# Patient Record
Sex: Male | Born: 1940 | Race: Black or African American | Hispanic: No | Marital: Married | Smoking: Former smoker
Health system: Southern US, Community
[De-identification: ages and names within clinical notes are randomized; demographics above are authoritative.]

## PROBLEM LIST (undated history)

## (undated) DIAGNOSIS — I255 Ischemic cardiomyopathy: Secondary | ICD-10-CM

## (undated) DIAGNOSIS — G629 Polyneuropathy, unspecified: Secondary | ICD-10-CM

## (undated) DIAGNOSIS — I10 Essential (primary) hypertension: Secondary | ICD-10-CM

## (undated) DIAGNOSIS — E785 Hyperlipidemia, unspecified: Secondary | ICD-10-CM

## (undated) DIAGNOSIS — G709 Myoneural disorder, unspecified: Secondary | ICD-10-CM

## (undated) DIAGNOSIS — I2510 Atherosclerotic heart disease of native coronary artery without angina pectoris: Secondary | ICD-10-CM

## (undated) DIAGNOSIS — I509 Heart failure, unspecified: Secondary | ICD-10-CM

## (undated) DIAGNOSIS — E119 Type 2 diabetes mellitus without complications: Secondary | ICD-10-CM

## (undated) HISTORY — PX: OTHER SURGICAL HISTORY: SHX169

---

## 2006-05-21 ENCOUNTER — Ambulatory Visit: Payer: Self-pay | Admitting: Otolaryngology

## 2007-04-11 ENCOUNTER — Emergency Department: Payer: Self-pay | Admitting: Emergency Medicine

## 2007-04-13 ENCOUNTER — Emergency Department: Payer: Self-pay | Admitting: Emergency Medicine

## 2009-04-04 ENCOUNTER — Ambulatory Visit: Payer: Self-pay | Admitting: Family Medicine

## 2009-04-26 ENCOUNTER — Ambulatory Visit: Payer: Self-pay | Admitting: Family Medicine

## 2009-05-31 ENCOUNTER — Ambulatory Visit: Payer: Self-pay | Admitting: Otolaryngology

## 2009-09-13 ENCOUNTER — Ambulatory Visit: Payer: Self-pay | Admitting: Gastroenterology

## 2010-03-26 ENCOUNTER — Ambulatory Visit: Payer: Self-pay | Admitting: Internal Medicine

## 2012-11-11 ENCOUNTER — Emergency Department: Payer: Self-pay | Admitting: Emergency Medicine

## 2015-02-03 ENCOUNTER — Encounter: Payer: Self-pay | Admitting: *Deleted

## 2015-02-06 ENCOUNTER — Ambulatory Visit
Admission: RE | Admit: 2015-02-06 | Discharge: 2015-02-06 | Disposition: A | Discharge status: Home / Self Care | Payer: Medicare Other | Source: Ambulatory Visit | Attending: Gastroenterology | Admitting: Gastroenterology

## 2015-02-06 ENCOUNTER — Encounter
Admission: RE | Disposition: A | Discharge status: Home / Self Care | Payer: Self-pay | Source: Ambulatory Visit | Attending: Gastroenterology

## 2015-02-06 ENCOUNTER — Ambulatory Visit: Payer: Medicare Other | Admitting: Registered Nurse

## 2015-02-06 DIAGNOSIS — E785 Hyperlipidemia, unspecified: Secondary | ICD-10-CM | POA: Insufficient documentation

## 2015-02-06 DIAGNOSIS — Z86010 Personal history of colonic polyps: Secondary | ICD-10-CM | POA: Insufficient documentation

## 2015-02-06 DIAGNOSIS — F17200 Nicotine dependence, unspecified, uncomplicated: Secondary | ICD-10-CM | POA: Diagnosis not present

## 2015-02-06 DIAGNOSIS — Z79899 Other long term (current) drug therapy: Secondary | ICD-10-CM | POA: Insufficient documentation

## 2015-02-06 DIAGNOSIS — Z09 Encounter for follow-up examination after completed treatment for conditions other than malignant neoplasm: Secondary | ICD-10-CM | POA: Insufficient documentation

## 2015-02-06 DIAGNOSIS — I10 Essential (primary) hypertension: Secondary | ICD-10-CM | POA: Insufficient documentation

## 2015-02-06 DIAGNOSIS — E1140 Type 2 diabetes mellitus with diabetic neuropathy, unspecified: Secondary | ICD-10-CM | POA: Diagnosis not present

## 2015-02-06 HISTORY — DX: Polyneuropathy, unspecified: G62.9

## 2015-02-06 HISTORY — DX: Essential (primary) hypertension: I10

## 2015-02-06 HISTORY — PX: COLONOSCOPY WITH PROPOFOL: SHX5780

## 2015-02-06 HISTORY — DX: Myoneural disorder, unspecified: G70.9

## 2015-02-06 HISTORY — DX: Hyperlipidemia, unspecified: E78.5

## 2015-02-06 HISTORY — DX: Type 2 diabetes mellitus without complications: E11.9

## 2015-02-06 LAB — GLUCOSE, CAPILLARY: GLUCOSE-CAPILLARY: 102 mg/dL — AB (ref 65–99)

## 2015-02-06 SURGERY — COLONOSCOPY WITH PROPOFOL
Anesthesia: General

## 2015-02-06 MED ORDER — PROPOFOL INFUSION 10 MG/ML OPTIME
INTRAVENOUS | Status: DC | PRN
  Administered 2015-02-06: 160 ug/kg/min via INTRAVENOUS

## 2015-02-06 MED ORDER — SODIUM CHLORIDE 0.9 % IV SOLN
INTRAVENOUS | Status: DC
  Administered 2015-02-06: 1000 mL via INTRAVENOUS

## 2015-02-06 MED ORDER — SODIUM CHLORIDE 0.9 % IV SOLN: INTRAVENOUS | Status: DC

## 2015-02-06 MED ORDER — MIDAZOLAM HCL 2 MG/2ML IJ SOLN
INTRAMUSCULAR | Status: DC | PRN
  Administered 2015-02-06: 2 mg via INTRAVENOUS

## 2015-02-06 MED ORDER — FENTANYL CITRATE (PF) 100 MCG/2ML IJ SOLN
INTRAMUSCULAR | Status: DC | PRN
  Administered 2015-02-06: 50 ug via INTRAVENOUS

## 2015-02-06 MED ORDER — EPHEDRINE SULFATE 50 MG/ML IJ SOLN
INTRAMUSCULAR | Status: DC | PRN
  Administered 2015-02-06: 10 mg via INTRAVENOUS
  Administered 2015-02-06: 15 mg via INTRAVENOUS

## 2015-02-06 MED ORDER — LIDOCAINE HCL (CARDIAC) 20 MG/ML IV SOLN
INTRAVENOUS | Status: DC | PRN
  Administered 2015-02-06: 40 mg via INTRAVENOUS

## 2015-02-06 MED ORDER — PROPOFOL 10 MG/ML IV BOLUS
INTRAVENOUS | Status: DC | PRN
  Administered 2015-02-06: 50 mg via INTRAVENOUS

## 2015-02-06 NOTE — Anesthesia Postprocedure Evaluation (Signed)
  Anesthesia Post-op Note  Patient: Todd Perez  Procedure(s) Performed: Procedure(s): COLONOSCOPY WITH PROPOFOL (N/A)  Anesthesia type:General  Patient location: PACU  Post pain: Pain level controlled  Post assessment: Post-op Vital signs reviewed, Patient's Cardiovascular Status Stable, Respiratory Function Stable, Patent Airway and No signs of Nausea or vomiting  Post vital signs: Reviewed and stable  Last Vitals:  Filed Vitals:   02/06/15 0900  BP: 113/70  Pulse: 61  Temp:   Resp: 19    Level of consciousness: awake, alert  and patient cooperative  Complications: No apparent anesthesia complications

## 2015-02-06 NOTE — Anesthesia Preprocedure Evaluation (Signed)
Anesthesia Evaluation  Patient identified by MRN, date of birth, ID band Patient awake    Reviewed: Allergy & Precautions, H&P , NPO status , Patient's Chart, lab work & pertinent test results, reviewed documented beta blocker date and time   Airway Mallampati: II  TM Distance: >3 FB Neck ROM: full    Dental no notable dental hx.    Pulmonary neg pulmonary ROS, Current Smoker,  breath sounds clear to auscultation  Pulmonary exam normal       Cardiovascular Exercise Tolerance: Good hypertension, negative cardio ROS  Rhythm:regular Rate:Normal     Neuro/Psych  Neuromuscular disease negative neurological ROS  negative psych ROS   GI/Hepatic negative GI ROS, Neg liver ROS,   Endo/Other  negative endocrine ROSdiabetes  Renal/GU negative Renal ROS  negative genitourinary   Musculoskeletal   Abdominal   Peds  Hematology negative hematology ROS (+)   Anesthesia Other Findings   Reproductive/Obstetrics negative OB ROS                             Anesthesia Physical Anesthesia Plan  ASA: III  Anesthesia Plan: General   Post-op Pain Management:    Induction:   Airway Management Planned:   Additional Equipment:   Intra-op Plan:   Post-operative Plan:   Informed Consent: I have reviewed the patients History and Physical, chart, labs and discussed the procedure including the risks, benefits and alternatives for the proposed anesthesia with the patient or authorized representative who has indicated his/her understanding and acceptance.   Dental Advisory Given  Plan Discussed with: CRNA  Anesthesia Plan Comments:         Anesthesia Quick Evaluation

## 2015-02-06 NOTE — Transfer of Care (Signed)
Immediate Anesthesia Transfer of Care Note  Patient: Todd Perez  Procedure(s) Performed: Procedure(s): COLONOSCOPY WITH PROPOFOL (N/A)  Patient Location: PACU and Endoscopy Unit  Anesthesia Type:General  Level of Consciousness: sedated  Airway & Oxygen Therapy: Patient Spontanous Breathing and Patient connected to nasal cannula oxygen  Post-op Assessment: Report given to RN and Post -op Vital signs reviewed and stable  Post vital signs: Reviewed and stable  Last Vitals:  Filed Vitals:   02/06/15 0837  BP: 94/56  Pulse: 59  Temp: 36.4 C  Resp: 14    Complications: No apparent anesthesia complications

## 2015-02-06 NOTE — H&P (Signed)
    Primary Care Physician:  [name], MD Primary Gastroenterologist:  Dr. Bluford Kaufmann  Pre-Procedure History & Physical: HPI:  Todd Perez is a 74 y.o. male is here for colonoscopy.   Past Medical History  Diagnosis Date  . Diabetes mellitus without complication   . Neuromuscular disorder   . Peripheral neuropathy   . Hyperlipidemia   . Hypertension     No past surgical history on file.  Prior to Admission medications   Medication Sig Start Date End Date Taking? Authorizing Provider  allopurinol (ZYLOPRIM) 100 MG tablet Take 100 mg by mouth daily.   Yes Historical Provider, MD  atenolol (TENORMIN) 100 MG tablet Take 100 mg by mouth daily.   Yes Historical Provider, MD  gabapentin (NEURONTIN) 300 MG capsule Take 300 mg by mouth 3 (three) times daily.   Yes Historical Provider, MD  glipiZIDE (GLUCOTROL) 10 MG tablet Take 10 mg by mouth daily before breakfast.   Yes Historical Provider, MD  losartan-hydrochlorothiazide (HYZAAR) 100-25 MG per tablet Take 1 tablet by mouth daily.   Yes Historical Provider, MD  metFORMIN (GLUCOPHAGE) 500 MG tablet Take by mouth 2 (two) times daily with a meal.   Yes Historical Provider, MD  pravastatin (PRAVACHOL) 40 MG tablet Take 40 mg by mouth daily.   Yes Historical Provider, MD  varenicline (CHANTIX PAK) 0.5 MG X 11 & 1 MG X 42 tablet Take by mouth 2 (two) times daily. Take one 0.5 mg tablet by mouth once daily for 3 days, then increase to one 0.5 mg tablet twice daily for 4 days, then increase to one 1 mg tablet twice daily.   Yes Historical Provider, MD    Allergies as of 01/25/2015  . (Not on File)    No family history on file.  History   Social History  . Marital Status: Married    Spouse Name: N/A  . Number of Children: N/A  . Years of Education: N/A   Occupational History  . Not on file.   Social History Main Topics  . Smoking status: Current Every Day Smoker  . Smokeless tobacco: Not on file  . Alcohol Use: Not on file  .  Drug Use: Not on file  . Sexual Activity: Not on file   Other Topics Concern  . Not on file   Social History Narrative  . No narrative on file    Review of Systems: See HPI, otherwise negative ROS  Physical Exam: BP 119/69 mmHg  Pulse 56  Temp(Src) 96.5 F (35.8 C) (Tympanic)  Resp 16  Ht 6\' 2"  (1.88 m)  Wt 89.812 kg (198 lb)  BMI 25.41 kg/m2  SpO2 100% General:   Alert,  pleasant and cooperative in NAD Head:  Normocephalic and atraumatic. Neck:  Supple; no masses or thyromegaly. Lungs:  Clear throughout to auscultation.    Heart:  Regular rate and rhythm. Abdomen:  Soft, nontender and nondistended. Normal bowel sounds, without guarding, and without rebound.   Neurologic:  Alert and  oriented x4;  grossly normal neurologically.  Impression/Plan: Todd Perez is here for an colonoscpy  to be performed for personal hx of colon polyps  Risks, benefits, limitations, and alternatives regarding colonoscopy have been reviewed with the patient.  Questions have been answered.  All parties agreeable.   Todd Perez, Todd Standing, MD  02/06/2015, 8:06 AM

## 2015-02-06 NOTE — Op Note (Signed)
[facility] Gastroenterology Patient Name: Todd Perez Procedure Date: 02/06/2015 8:02 AM MRN: [id number] Account #: [id number] Date of Birth: 1940/09/16 Admit Type: Outpatient Age: 74 Room: [facility] ENDO ROOM 4 Gender: Male Note Status: Finalized Procedure:         Colonoscopy Indications:       Personal history of colonic polyps Providers:         Ezzard Standing. Bluford Kaufmann, MD Referring MD:      Rhona Leavens. Burnett Sheng, MD (Referring MD) Medicines:         Monitored Anesthesia Care Complications:     No immediate complications. Procedure:         Pre-Anesthesia Assessment:                    - Prior to the procedure, a History and Physical was                     performed, and patient medications, allergies and                     sensitivities were reviewed. The patient's tolerance of                     previous anesthesia was reviewed.                    - The risks and benefits of the procedure and the sedation                     options and risks were discussed with the patient. All                     questions were answered and informed consent was obtained.                    - After reviewing the risks and benefits, the patient was                     deemed in satisfactory condition to undergo the procedure.                    After obtaining informed consent, the colonoscope was                     passed under direct vision. Throughout the procedure, the                     patient's blood pressure, pulse, and oxygen saturations                     were monitored continuously. The Colonoscope was                     introduced through the anus and advanced to the the cecum,                     identified by appendiceal orifice and ileocecal valve. The                     colonoscopy was performed without difficulty. The patient                     tolerated the procedure well. The quality of the bowel  preparation was good. Findings:      The colon  (entire examined portion) appeared normal. Impression:        - The entire examined colon is normal.                    - No specimens collected. Recommendation:    - Discharge patient to home.                    - Repeat colonoscopy in 5 years for surveillance.                    - The findings and recommendations were discussed with the                     patient. Procedure Code(s): --- Professional ---                    [phone], Colonoscopy, flexible; diagnostic, including                     collection of specimen(s) by brushing or washing, when                     performed (separate procedure) Diagnosis Code(s): --- Professional ---                    Z86.010, Personal history of colonic polyps CPT copyright 2014 American Medical Association. All rights reserved. The codes documented in this report are preliminary and upon coder review may  be revised to meet current compliance requirements. Wallace Cullens, MD 02/06/2015 8:27:41 AM This report has been signed electronically. Number of Addenda: 0 Note Initiated On: 02/06/2015 8:02 AM Scope Withdrawal Time: 0 hours 8 minutes 41 seconds  Total Procedure Duration: 0 hours 13 minutes 56 seconds       [facility]

## 2015-02-06 NOTE — Anesthesia Procedure Notes (Signed)
Date/Time: 02/06/2015 8:03 AM Performed by: Stormy Fabian Pre-anesthesia Checklist: Patient identified, Emergency Drugs available, Suction available and Patient being monitored Patient Re-evaluated:Patient Re-evaluated prior to inductionOxygen Delivery Method: Nasal cannula

## 2015-02-08 ENCOUNTER — Encounter: Payer: Self-pay | Admitting: Gastroenterology

## 2018-04-18 ENCOUNTER — Other Ambulatory Visit: Payer: Self-pay

## 2018-04-18 ENCOUNTER — Emergency Department: Payer: Medicare Other

## 2018-04-18 ENCOUNTER — Observation Stay
Admission: EM | Admit: 2018-04-18 | Discharge: 2018-04-20 | Disposition: A | Discharge status: Home / Self Care | Payer: Medicare Other | Attending: Internal Medicine | Admitting: Internal Medicine

## 2018-04-18 DIAGNOSIS — R0602 Shortness of breath: Secondary | ICD-10-CM | POA: Diagnosis present

## 2018-04-18 DIAGNOSIS — Z79899 Other long term (current) drug therapy: Secondary | ICD-10-CM | POA: Diagnosis not present

## 2018-04-18 DIAGNOSIS — I272 Pulmonary hypertension, unspecified: Secondary | ICD-10-CM | POA: Diagnosis not present

## 2018-04-18 DIAGNOSIS — M109 Gout, unspecified: Secondary | ICD-10-CM | POA: Diagnosis not present

## 2018-04-18 DIAGNOSIS — E785 Hyperlipidemia, unspecified: Secondary | ICD-10-CM | POA: Diagnosis not present

## 2018-04-18 DIAGNOSIS — Z7984 Long term (current) use of oral hypoglycemic drugs: Secondary | ICD-10-CM | POA: Diagnosis not present

## 2018-04-18 DIAGNOSIS — I5033 Acute on chronic diastolic (congestive) heart failure: Secondary | ICD-10-CM | POA: Insufficient documentation

## 2018-04-18 DIAGNOSIS — R778 Other specified abnormalities of plasma proteins: Secondary | ICD-10-CM

## 2018-04-18 DIAGNOSIS — R918 Other nonspecific abnormal finding of lung field: Secondary | ICD-10-CM | POA: Diagnosis not present

## 2018-04-18 DIAGNOSIS — Z23 Encounter for immunization: Secondary | ICD-10-CM | POA: Diagnosis not present

## 2018-04-18 DIAGNOSIS — Z87891 Personal history of nicotine dependence: Secondary | ICD-10-CM | POA: Insufficient documentation

## 2018-04-18 DIAGNOSIS — E1142 Type 2 diabetes mellitus with diabetic polyneuropathy: Secondary | ICD-10-CM | POA: Diagnosis not present

## 2018-04-18 DIAGNOSIS — I509 Heart failure, unspecified: Secondary | ICD-10-CM

## 2018-04-18 DIAGNOSIS — R7989 Other specified abnormal findings of blood chemistry: Secondary | ICD-10-CM | POA: Insufficient documentation

## 2018-04-18 DIAGNOSIS — I081 Rheumatic disorders of both mitral and tricuspid valves: Secondary | ICD-10-CM | POA: Insufficient documentation

## 2018-04-18 DIAGNOSIS — I110 Hypertensive heart disease with heart failure: Principal | ICD-10-CM | POA: Insufficient documentation

## 2018-04-18 DIAGNOSIS — R748 Abnormal levels of other serum enzymes: Secondary | ICD-10-CM | POA: Diagnosis present

## 2018-04-18 LAB — CBC
HCT: 38.9 % — ABNORMAL LOW (ref 40.0–52.0)
Hemoglobin: 13.4 g/dL (ref 13.0–18.0)
MCH: 30.7 pg (ref 26.0–34.0)
MCHC: 34.5 g/dL (ref 32.0–36.0)
MCV: 89.1 fL (ref 80.0–100.0)
PLATELETS: 294 10*3/uL (ref 150–440)
RBC: 4.37 MIL/uL — AB (ref 4.40–5.90)
RDW: 14.9 % — AB (ref 11.5–14.5)
WBC: 7 10*3/uL (ref 3.8–10.6)

## 2018-04-18 LAB — BASIC METABOLIC PANEL
Anion gap: 8 (ref 5–15)
BUN: 15 mg/dL (ref 8–23)
CALCIUM: 9.1 mg/dL (ref 8.9–10.3)
CHLORIDE: 103 mmol/L (ref 98–111)
CO2: 27 mmol/L (ref 22–32)
CREATININE: 1.22 mg/dL (ref 0.61–1.24)
GFR calc Af Amer: >60 mL/min (ref >60)
GFR calc non Af Amer: 55 mL/min — ABNORMAL LOW (ref >60)
Glucose, Bld: 107 mg/dL — ABNORMAL HIGH (ref 70–99)
Potassium: 4.3 mmol/L (ref 3.5–5.1)
SODIUM: 138 mmol/L (ref 135–145)

## 2018-04-18 LAB — TROPONIN I
TROPONIN I: 0.16 ng/mL — AB (ref <0.03)
TROPONIN I: 0.16 ng/mL — AB (ref <0.03)
TROPONIN I: 0.13 ng/mL — AB (ref <0.03)

## 2018-04-18 LAB — BRAIN NATRIURETIC PEPTIDE: B Natriuretic Peptide: 585 pg/mL — ABNORMAL HIGH (ref 0.0–100.0)

## 2018-04-18 MED ORDER — FUROSEMIDE 10 MG/ML IJ SOLN
20.0000 mg | Freq: Two times a day (BID) | INTRAMUSCULAR | Status: DC
  Administered 2018-04-18 – 2018-04-20 (×4): 20 mg via INTRAVENOUS
  Filled 2018-04-18 (×4): qty 2

## 2018-04-18 MED ORDER — ACETAMINOPHEN 650 MG RE SUPP: 650.0000 mg | Freq: Four times a day (QID) | RECTAL | Status: DC | PRN

## 2018-04-18 MED ORDER — ONDANSETRON HCL 4 MG/2ML IJ SOLN: 4.0000 mg | Freq: Four times a day (QID) | INTRAMUSCULAR | Status: DC | PRN

## 2018-04-18 MED ORDER — ACETAMINOPHEN 325 MG PO TABS: 650.0000 mg | ORAL_TABLET | Freq: Four times a day (QID) | ORAL | Status: DC | PRN

## 2018-04-18 MED ORDER — LOSARTAN POTASSIUM 50 MG PO TABS
100.0000 mg | ORAL_TABLET | Freq: Every day | ORAL | Status: DC
  Administered 2018-04-19 – 2018-04-20 (×2): 100 mg via ORAL
  Filled 2018-04-18 (×2): qty 2

## 2018-04-18 MED ORDER — ONDANSETRON HCL 4 MG PO TABS: 4.0000 mg | ORAL_TABLET | Freq: Four times a day (QID) | ORAL | Status: DC | PRN

## 2018-04-18 MED ORDER — ENOXAPARIN SODIUM 40 MG/0.4ML ~~LOC~~ SOLN
40.0000 mg | SUBCUTANEOUS | Status: DC
  Administered 2018-04-18 – 2018-04-19 (×2): 40 mg via SUBCUTANEOUS
  Filled 2018-04-18 (×2): qty 0.4

## 2018-04-18 MED ORDER — HYDROCHLOROTHIAZIDE 25 MG PO TABS
25.0000 mg | ORAL_TABLET | Freq: Every day | ORAL | Status: DC
  Administered 2018-04-19 – 2018-04-20 (×2): 25 mg via ORAL
  Filled 2018-04-18 (×2): qty 1

## 2018-04-18 MED ORDER — ASPIRIN 81 MG PO CHEW
324.0000 mg | CHEWABLE_TABLET | Freq: Once | ORAL | Status: AC
  Administered 2018-04-18: 324 mg via ORAL
  Filled 2018-04-18: qty 4

## 2018-04-18 MED ORDER — INFLUENZA VAC SPLIT HIGH-DOSE 0.5 ML IM SUSY
0.5000 mL | PREFILLED_SYRINGE | INTRAMUSCULAR | Status: AC
  Administered 2018-04-19: 0.5 mL via INTRAMUSCULAR
  Filled 2018-04-18: qty 0.5

## 2018-04-18 MED ORDER — PNEUMOCOCCAL VAC POLYVALENT 25 MCG/0.5ML IJ INJ
0.5000 mL | INJECTION | INTRAMUSCULAR | Status: AC
  Administered 2018-04-20: 0.5 mL via INTRAMUSCULAR
  Filled 2018-04-18: qty 0.5

## 2018-04-18 MED ORDER — ATENOLOL 100 MG PO TABS
100.0000 mg | ORAL_TABLET | Freq: Every day | ORAL | Status: DC
  Filled 2018-04-18: qty 1

## 2018-04-18 MED ORDER — SODIUM CHLORIDE 0.9% FLUSH
3.0000 mL | Freq: Two times a day (BID) | INTRAVENOUS | Status: DC
  Administered 2018-04-18 – 2018-04-19 (×3): 3 mL via INTRAVENOUS

## 2018-04-18 MED ORDER — LOSARTAN POTASSIUM-HCTZ 100-25 MG PO TABS: 1.0000 | ORAL_TABLET | Freq: Every day | ORAL | Status: DC

## 2018-04-18 MED ORDER — ALLOPURINOL 100 MG PO TABS
100.0000 mg | ORAL_TABLET | Freq: Every day | ORAL | Status: DC | PRN
  Filled 2018-04-18: qty 1

## 2018-04-18 NOTE — Consult Note (Signed)
[facility] Cardiology  CARDIOLOGY CONSULT NOTE  Patient ID: Todd Perez MRN: [id number] DOB/AGE: 78-21-1942 78 y.o.  Admit date: 04/18/2018 Referring Physician Cherlynn Kaiser Primary Physician [facility] Primary Cardiologist Fath Reason for Consultation congestive heart failure  HPI: 77 year old gentleman referred for evaluation of congestive heart failure.  The patient has a one-month history of exertional dyspnea as well as shortness of breath at rest and productive cough.  Treated with antibiotics for outpatient pneumonia.  He was recently seen by Dr. Ihor Austin and 2D echocardiogram was performed which revealed mild reduced left ventricular function, with LVEF 45%.  Patient is continued to experience worsening shortness of breath with peripheral edema.  He presents to [facility] ED where chest x-ray revealed mild interstitial edema.  Initial labs were notable for borderline elevated troponin of 0.16.  The patient denies chest pain.  ECG revealed normal sinus rhythm without acute ischemic ST-T wave changes.  Review of systems complete and found to be negative unless listed above     Past Medical History:  Diagnosis Date  . Diabetes mellitus without complication (HCC)   . Hyperlipidemia   . Hypertension   . Neuromuscular disorder (HCC)   . Peripheral neuropathy     Past Surgical History:  Procedure Laterality Date  . COLONOSCOPY WITH PROPOFOL N/A 02/06/2015   Procedure: COLONOSCOPY WITH PROPOFOL;  Surgeon: Wallace Cullens, MD;  Location: [facility] ENDOSCOPY;  Service: Gastroenterology;  Laterality: N/A;    Medications Prior to Admission  Medication Sig Dispense Refill Last Dose  . allopurinol (ZYLOPRIM) 100 MG tablet Take 100 mg by mouth daily as needed (gout prevention).    Unknown at PRN  . atenolol (TENORMIN) 100 MG tablet Take 100 mg by mouth daily.   04/18/2018 at 0800  . losartan-hydrochlorothiazide (HYZAAR) 100-25 MG per tablet Take 1 tablet by mouth daily.   04/18/2018 at 0800   Social History   Socioeconomic  History  . Marital status: Married    Spouse name: Not on file  . Number of children: Not on file  . Years of education: Not on file  . Highest education level: Not on file  Occupational History  . Not on file  Social Needs  . Financial resource strain: Not on file  . Food insecurity:    Worry: Not on file    Inability: Not on file  . Transportation needs:    Medical: Not on file    Non-medical: Not on file  Tobacco Use  . Smoking status: Former Smoker    Packs/day: 0.25    Years: 20.00    Pack years: 5.00    Types: Cigarettes  Substance and Sexual Activity  . Alcohol use: Not Currently  . Drug use: Not on file  . Sexual activity: Not on file  Lifestyle  . Physical activity:    Days per week: Not on file    Minutes per session: Not on file  . Stress: Not on file  Relationships  . Social connections:    Talks on phone: Not on file    Gets together: Not on file    Attends religious service: Not on file    Active member of club or organization: Not on file    Attends meetings of clubs or organizations: Not on file    Relationship status: Not on file  . Intimate partner violence:    Fear of current or ex partner: Not on file    Emotionally abused: Not on file    Physically abused: Not on file  Forced sexual activity: Not on file  Other Topics Concern  . Not on file  Social History Narrative  . Not on file    Family History  Problem Relation Age of Onset  . Kidney disease Mother   . Emphysema Father       Review of systems complete and found to be negative unless listed above      PHYSICAL EXAM  General: Well developed, well nourished, in no acute distress HEENT:  Normocephalic and atramatic Neck:  No JVD.  Lungs: Clear bilaterally to auscultation and percussion. Heart: HRRR . Normal S1 and S2 without gallops or murmurs.  Abdomen: Bowel sounds are positive, abdomen soft and non-tender  Msk:  Back normal, normal gait. Normal strength and tone for  age. Extremities: No clubbing, cyanosis or edema.   Neuro: Alert and oriented X 3. Psych:  Good affect, responds appropriately  Labs:   Lab Results  Component Value Date   WBC 7.0 04/18/2018   HGB 13.4 04/18/2018   HCT 38.9 (L) 04/18/2018   MCV 89.1 04/18/2018   PLT 294 04/18/2018    Recent Labs  Lab 04/18/18 1224  NA 138  K 4.3  CL 103  CO2 27  BUN 15  CREATININE 1.22  CALCIUM 9.1  GLUCOSE 107*   Lab Results  Component Value Date   TROPONINI 0.16 (HH) 04/18/2018   No results found for: CHOL No results found for: HDL No results found for: LDLCALC No results found for: TRIG No results found for: CHOLHDL No results found for: LDLDIRECT    Radiology: Dg Chest 2 View  Result Date: 04/18/2018 CLINICAL DATA:  Persistent shortness of breath. EXAM: CHEST - 2 VIEW COMPARISON:  Chest x-ray report dated March 31, 2018. FINDINGS: Mild cardiomegaly. Normal pulmonary vascularity. Mildly increased interstitial markings. Mild patchy opacity in the left lower lobe. No pneumothorax or pleural effusion. No acute osseous abnormality. IMPRESSION: 1. Cardiomegaly with mild interstitial pulmonary edema. 2. Patchy opacity in the left lower lobe could reflect atelectasis or infiltrate. Electronically Signed   By: Obie Dredge M.D.   On: 04/18/2018 12:57    EKG: Normal sinus rhythm at 65 bpm  ASSESSMENT AND PLAN:   1.  Acute diastolic congestive heart failure, observed on chest x-ray, with peripheral edema.  2D echocardiogram revealed mild reduced left ventricular function, with LV ejection fraction 45% 2.  Borderline elevated troponin, in the absence of chest pain or ECG changes, likely demand supply ischemia  Recommendations  1.  Agree with current therapy 2.  Continue diuresis 3.  Carefully monitor renal status 4.  Defer for this anticoagulation in the absence of chest pain 5.  Further recommendations pending patient's initial clinical course  Signed: Marcina Millard MD,PhD,  [facility] 04/18/2018, 3:49 PM

## 2018-04-18 NOTE — H&P (Signed)
[employer] [facility] [place] [facility]   PATIENT NAME: Todd Perez    MR#:  [id number]  DATE OF BIRTH:  Mar 08, 1941  DATE OF ADMISSION:  04/18/2018  PRIMARY CARE PHYSICIAN: [name], MD   REQUESTING/REFERRING PHYSICIAN: Dr. Governor Rooks  CHIEF COMPLAINT:   Chief Complaint  Patient presents with  . Shortness of Breath    HISTORY OF PRESENT ILLNESS:  Todd Perez  is a 77 y.o. male with a known history of diabetes, essential hypertension, hyperlipidemia, peripheral neuropathy who presents to the hospital complaining of shortness of breath.  Patient says he has been having shortness of breath now ongoing for the past month but this past week it has gotten significantly worse.  Shortness of breath worse with exertion rather than at rest.  He denies nausea, chest pain, but admits to some intermittent paroxysmal nocturnal dyspnea but no orthopnea.  Patient denies any cough, congestion, fever, chills, sick contacts.  Since he was having progressive shortness of breath over the past week he was seen by his cardiologist last week and had a echocardiogram done, the echo showed EF of 45% with some mild mitral regurgitation and tricuspid regurgitation with some mild pulmonary hypertension.  Patient has not received the results of this echocardiogram yet and was supposed to see his cardiologist next week but due to his symptoms above he presented to the ER for further evaluation.  In the ER patient underwent a chest x-ray which showed evidence of mild interstitial edema and also mildly elevated troponin.  Hospitalist services were contacted for admission.  PAST MEDICAL HISTORY:   Past Medical History:  Diagnosis Date  . Diabetes mellitus without complication (HCC)   . Hyperlipidemia   . Hypertension   . Neuromuscular disorder (HCC)   . Peripheral neuropathy     PAST SURGICAL HISTORY:   Past Surgical History:  Procedure Laterality Date  . COLONOSCOPY WITH PROPOFOL N/A  02/06/2015   Procedure: COLONOSCOPY WITH PROPOFOL;  Surgeon: Wallace Cullens, MD;  Location: [facility] ENDOSCOPY;  Service: Gastroenterology;  Laterality: N/A;    SOCIAL HISTORY:   Social History   Tobacco Use  . Smoking status: Former Smoker    Packs/day: 0.25    Years: 20.00    Pack years: 5.00    Types: Cigarettes  Substance Use Topics  . Alcohol use: Not Currently    FAMILY HISTORY:   Family History  Problem Relation Age of Onset  . Kidney disease Mother   . Emphysema Father     DRUG ALLERGIES:   Allergies  Allergen Reactions  . Benicar Hct [Olmesartan Medoxomil-Hctz] Itching  . Allopurinol   . Lisinopril Cough    REVIEW OF SYSTEMS:   Review of Systems  Constitutional: Negative for fever and weight loss.  HENT: Negative for congestion, nosebleeds and tinnitus.   Eyes: Negative for blurred vision, double vision and redness.  Respiratory: Positive for shortness of breath. Negative for cough and hemoptysis.   Cardiovascular: Positive for PND. Negative for chest pain, orthopnea and leg swelling.  Gastrointestinal: Negative for abdominal pain, diarrhea, melena, nausea and vomiting.  Genitourinary: Negative for dysuria, hematuria and urgency.  Musculoskeletal: Negative for falls and joint pain.  Neurological: Negative for dizziness, tingling, sensory change, focal weakness, seizures, weakness and headaches.  Endo/Heme/Allergies: Negative for polydipsia. Does not bruise/bleed easily.  Psychiatric/Behavioral: Negative for depression and memory loss. The patient is not nervous/anxious.     MEDICATIONS AT HOME:   Prior to Admission medications   Medication Sig Start  Date End Date Taking? Authorizing Provider  allopurinol (ZYLOPRIM) 100 MG tablet Take 100 mg by mouth daily as needed (gout prevention).    Yes [provider]  atenolol (TENORMIN) 100 MG tablet Take 100 mg by mouth daily.   Yes [provider]  losartan-hydrochlorothiazide (HYZAAR) 100-25 MG per  tablet Take 1 tablet by mouth daily.   Yes [provider]      VITAL SIGNS:  Blood pressure (!) 131/97, pulse 64, temperature (!) 97.5 F (36.4 C), temperature source Oral, resp. rate 17, height 6\' 1"  (1.854 m), weight 90.7 kg, SpO2 99 %.  PHYSICAL EXAMINATION:  Physical Exam  GENERAL:  77 y.o.-year-old patient lying in the bed with no acute distress.  EYES: Pupils equal, round, reactive to light and accommodation. No scleral icterus. Extraocular muscles intact.  HEENT: Head atraumatic, normocephalic. Oropharynx and nasopharynx clear. No oropharyngeal erythema, moist oral mucosa  NECK:  Supple, no jugular venous distention. No thyroid enlargement, no tenderness.  LUNGS: Normal breath sounds bilaterally, no wheezing, rales, rhonchi. No use of accessory muscles of respiration.  CARDIOVASCULAR: S1, S2 RRR. No murmurs, rubs, gallops, clicks.  ABDOMEN: Soft, nontender, nondistended. Bowel sounds present. No organomegaly or mass.  EXTREMITIES: + 1 pedal edema b/l, No cyanosis, or clubbing. + 2 pedal & radial pulses b/l.   NEUROLOGIC: Cranial nerves II through XII are intact. No focal Motor or sensory deficits appreciated b/l PSYCHIATRIC: The patient is alert and oriented x 3.  SKIN: No obvious rash, lesion, or ulcer.   LABORATORY PANEL:   CBC Recent Labs  Lab 04/18/18 1224  WBC 7.0  HGB 13.4  HCT 38.9*  PLT 294   ------------------------------------------------------------------------------------------------------------------  Chemistries  Recent Labs  Lab 04/18/18 1224  NA 138  K 4.3  CL 103  CO2 27  GLUCOSE 107*  BUN 15  CREATININE 1.22  CALCIUM 9.1   ------------------------------------------------------------------------------------------------------------------  Cardiac Enzymes Recent Labs  Lab 04/18/18 1224  TROPONINI 0.16*    ------------------------------------------------------------------------------------------------------------------  RADIOLOGY:  Dg Chest 2 View  Result Date: 04/18/2018 CLINICAL DATA:  Persistent shortness of breath. EXAM: CHEST - 2 VIEW COMPARISON:  Chest x-ray report dated March 31, 2018. FINDINGS: Mild cardiomegaly. Normal pulmonary vascularity. Mildly increased interstitial markings. Mild patchy opacity in the left lower lobe. No pneumothorax or pleural effusion. No acute osseous abnormality. IMPRESSION: 1. Cardiomegaly with mild interstitial pulmonary edema. 2. Patchy opacity in the left lower lobe could reflect atelectasis or infiltrate. Electronically Signed   By: Obie Dredge M.D.   On: 04/18/2018 12:57     IMPRESSION AND PLAN:   77 year old male with past medical history of hypertension, diabetes, hyperlipidemia who presents to the hospital due to shortness of breath and noted to be in congestive heart failure.  1.  CHF-acute systolic dysfunction.  Patient presents with progressive shortness of breath with chest x-ray findings suggestive of pulmonary edema.  He had a recent echocardiogram done last week showing EF of 45% with mild pulmonary hypertension with mild tricuspid and mitral regurgitation. - We will diurese the patient with IV Lasix, follow I's and O's and daily weights. -Continue atenolol, losartan/HCTZ. -I will also get a cardiology consult.  2.  Elevated troponin-likely in the setting of supply demand ischemia from CHF. -We will observe on telemetry, cycle patient's cardiac markers.  3.  Essential hypertension-continue atenolol, losartan, HCTZ.  4.  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  Continue allopurinol.    All the records are reviewed and case discussed with ED provider. Management plans  discussed with the patient, family and they are in agreement.  CODE STATUS: Full code  TOTAL TIME TAKING CARE OF THIS PATIENT: 40 minutes.    Houston Siren M.D on  04/18/2018 at 2:28 PM  Between 7am to 6pm - Pager - [phone]  After 6pm go to [email or web address] - password [email or web address] [facility]  [facility]  Office  [phone]  CC: Primary care physician; [name], MD

## 2018-04-18 NOTE — Progress Notes (Signed)
Patient offered EMMI video(s) to watch for educational purposes, however patient refuses at this time. Will continue to monitor. Jari Favre [facility]

## 2018-04-18 NOTE — ED Notes (Signed)
Informed first nurse Vikki Ports of positive troponin. Pt up next for a room.

## 2018-04-18 NOTE — ED Triage Notes (Signed)
Pt arrives to ED from [facility] c/o of worsening SOB. Denies CP. Denies COPD or CHF. States SOB x a month but worse over last week. KC called this RN and stated that he was seen in office and treated for pneumonia. Had ecco done which showed decreased EF. KC wants pt to see pulmonologist and stress test and is concerned about PE. 97% on RA at [facility].   Pt denies movement making SOB worse but feels like "breath gets cut off" when he lays down.   Productive cough (light brown/clear). No fever.   Alert, oriented, ambulatory. Denied wheelchair.

## 2018-04-18 NOTE — ED Provider Notes (Signed)
[facility] Emergency Department Provider Note ____________________________________________   I have reviewed the triage vital signs and the triage nursing note.  HISTORY  Chief Complaint Shortness of Breath   Historian Patient  HPI Todd Perez is a 77 y.o. male with history of diabetes, hypertension, hyperlipidemia, reports having shortness of breath for what sounds like a couple of months.  Patient states that he saw Dr. America Brown office and was unable to complete a stress test due to shortness of breath being too bad.  States that this morning the shortness of breath was a little bit worse.  Denies chest pain.  No nausea or sweats.  No altered mental status.  Symptoms are moderate.     Past Medical History:  Diagnosis Date  . Diabetes mellitus without complication (HCC)   . Hyperlipidemia   . Hypertension   . Neuromuscular disorder (HCC)   . Peripheral neuropathy     There are no active problems to display for this patient.   Past Surgical History:  Procedure Laterality Date  . COLONOSCOPY WITH PROPOFOL N/A 02/06/2015   Procedure: COLONOSCOPY WITH PROPOFOL;  Surgeon: Wallace Cullens, MD;  Location: [facility] ENDOSCOPY;  Service: Gastroenterology;  Laterality: N/A;    Prior to Admission medications   Medication Sig Start Date End Date Taking? Authorizing Provider  allopurinol (ZYLOPRIM) 100 MG tablet Take 100 mg by mouth daily.    [provider]  atenolol (TENORMIN) 100 MG tablet Take 100 mg by mouth daily.    [provider]  gabapentin (NEURONTIN) 300 MG capsule Take 300 mg by mouth 3 (three) times daily.    [provider]  glipiZIDE (GLUCOTROL) 10 MG tablet Take 10 mg by mouth daily before breakfast.    [provider]  losartan-hydrochlorothiazide (HYZAAR) 100-25 MG per tablet Take 1 tablet by mouth daily.    [provider]  metFORMIN (GLUCOPHAGE) 500 MG tablet Take by mouth 2 (two) times daily with a  meal.    [provider]  pravastatin (PRAVACHOL) 40 MG tablet Take 40 mg by mouth daily.    [provider]  varenicline (CHANTIX PAK) 0.5 MG X 11 & 1 MG X 42 tablet Take by mouth 2 (two) times daily. Take one 0.5 mg tablet by mouth once daily for 3 days, then increase to one 0.5 mg tablet twice daily for 4 days, then increase to one 1 mg tablet twice daily.    [provider]    Allergies  Allergen Reactions  . Benicar Hct [Olmesartan Medoxomil-Hctz] Itching  . Allopurinol   . Lisinopril Cough    History reviewed. No pertinent family history.  Social History Social History   Tobacco Use  . Smoking status: Current Every Day Smoker  Substance Use Topics  . Alcohol use: Not Currently  . Drug use: Not on file    Review of Systems  Constitutional: Negative for fever. Eyes: Negative for visual changes. ENT: Negative for sore throat. Cardiovascular: Negative for chest pain. Respiratory: Positive as per HPI for shortness of breath.  No coughing or sputum production. Gastrointestinal: Negative for abdominal pain, vomiting and diarrhea. Genitourinary: Negative for dysuria. Musculoskeletal: Negative for back pain. Skin: Negative for rash. Neurological: Negative for headache.  ____________________________________________   PHYSICAL EXAM:  VITAL SIGNS: ED Triage Vitals  Enc Vitals Group     BP 04/18/18 1222 110/77     Pulse Rate 04/18/18 1222 65     Resp 04/18/18 1222 16  Temp 04/18/18 1222 (!) 97.5 F (36.4 C)     Temp Source 04/18/18 1222 Oral     SpO2 04/18/18 1222 100 %     Weight 04/18/18 1220 200 lb (90.7 kg)     Height 04/18/18 1220 6\' 1"  (1.854 m)     Head Circumference --      Peak Flow --      Pain Score 04/18/18 1220 0     Pain Loc --      Pain Edu? --      Excl. in GC? --      Constitutional: Alert and oriented.  HEENT      Head: Normocephalic and atraumatic.      Eyes: Conjunctivae are normal. Pupils equal and round.        Ears:         Nose: No congestion/rhinnorhea.      Mouth/Throat: Mucous membranes are moist.      Neck: No stridor. Cardiovascular/Chest: Normal rate, regular rhythm.  No murmurs, rubs, or gallops. Respiratory: Normal respiratory effort without tachypnea nor retractions. Breath sounds are clear and equal bilaterally. No wheezes/rales/rhonchi. Gastrointestinal: Soft. No distention, no guarding, no rebound. Nontender.    Genitourinary/rectal:Deferred Musculoskeletal: Nontender with normal range of motion in all extremities. No joint effusions.  No lower extremity tenderness.  No edema. Neurologic:  Normal speech and language. No gross or focal neurologic deficits are appreciated. Skin:  Skin is warm, dry and intact. No rash noted. Psychiatric: Mood and affect are normal. Speech and behavior are normal. Patient exhibits appropriate insight and judgment.   ____________________________________________  LABS (pertinent positives/negatives) I, Governor Rooks, MD the attending physician have reviewed the labs noted below.  Labs Reviewed  BASIC METABOLIC PANEL - Abnormal; Notable for the following components:      Result Value   Glucose, Bld 107 (*)    GFR calc non Af Amer 55 (*)    All other components within normal limits  CBC - Abnormal; Notable for the following components:   RBC 4.37 (*)    HCT 38.9 (*)    RDW 14.9 (*)    All other components within normal limits  TROPONIN I - Abnormal; Notable for the following components:   Troponin I 0.16 (*)    All other components within normal limits  BRAIN NATRIURETIC PEPTIDE    ____________________________________________    EKG I, Governor Rooks, MD, the attending physician have personally viewed and interpreted all ECGs.  65 bpm.  Normal sinus rhythm.  Narrow QRS.  Left axis deviation.  Nonspecific intraventricular conduction delay.  No ST segment elevation ST segment depression nonspecific T wave 1 and aVL.    __________________  RADIOLOGY   CXR -by me, interpreted by radiologist:IMPRESSION: 1. Cardiomegaly with mild interstitial pulmonary edema. 2. Patchy opacity in the left lower lobe could reflect atelectasis or infiltrate. __________________________________________  PROCEDURES  Procedure(s) performed: None  Procedures  Critical Care performed: None   ____________________________________________  ED COURSE / ASSESSMENT AND PLAN  Pertinent labs & imaging results that were available during my care of the patient were reviewed by me and considered in my medical decision making (see chart for details).    Patient here for complaint of shortness of breath, not hypoxic.  No tachycardia or abnormal blood pressure.  Patient states he had increase lotion edema for 24 hours and worsening shortness of breath over the last 24 hours.  Patient reports no known history of coronary disease or CHF.  I did review  records from the echocardiogram at Dr. Lady Gary office just this past week showed to have mild decreased EF at 45%.  Clinically he may have some mild fluid overload and his chest x-ray shows some possible interstitial edema I may give some Lasix.  His troponin is elevated and I am going to give him aspirin as well as placed on heparin given the fact that he could be having ACS, and was unable/failed stress test this past week he will need cardiac evaluation and consultation in the hospital.  After discussion with hospitalist, suspect bump in troponin may more likely be due to CHF rather than ACS, and will hold off on heparin given risk of bleeding etc. until troponins are trended.  No EKG changes or chest pain.   Chest x-ray noted possibility for infiltrate, patient is not having coughing or elevated white blood cell count or fevers, I think infection is unlikely at this point time.    CONSULTATIONS: Hospitalist for admission.   Patient / Family / Caregiver informed of clinical  course, medical decision-making process, and agree with plan.     ___________________________________________   FINAL CLINICAL IMPRESSION(S) / ED DIAGNOSES   Final diagnoses:  SOB (shortness of breath)  Troponin level elevated      ___________________________________________         Note: This dictation was prepared with Dragon dictation. Any transcriptional errors that result from this process are unintentional    Governor Rooks, MD 04/18/18 1348

## 2018-04-19 LAB — TROPONIN I: TROPONIN I: 0.14 ng/mL — AB (ref <0.03)

## 2018-04-19 MED ORDER — ATENOLOL 25 MG PO TABS
25.0000 mg | ORAL_TABLET | Freq: Every day | ORAL | Status: DC
  Administered 2018-04-19 – 2018-04-20 (×2): 25 mg via ORAL
  Filled 2018-04-19 (×2): qty 1

## 2018-04-19 MED ORDER — ATENOLOL 25 MG PO TABS: 25.0000 mg | ORAL_TABLET | Freq: Every day | ORAL | Status: DC

## 2018-04-19 NOTE — Progress Notes (Signed)
[facility]  SUBJECTIVE: Patient laying flat in bed, reports feeling better, denies chest pain   Vitals:   04/18/18 1500 04/18/18 2012 04/19/18 0422 04/19/18 0752  BP: (!) 120/93 (!) 123/93 117/81 130/84  Pulse:  66 64 68  Resp:  18 18   Temp:  (!) 97.4 F (36.3 C) 97.9 F (36.6 C)   TempSrc:  Oral Oral   SpO2:  99% 97% 99%  Weight: 87.5 kg  91.7 kg   Height:         Intake/Output Summary (Last 24 hours) at 04/19/2018 1038 Last data filed at 04/19/2018 1610 Gross per 24 hour  Intake -  Output 2000 ml  Net -2000 ml      PHYSICAL EXAM  General: Well developed, well nourished, in no acute distress HEENT:  Normocephalic and atramatic Neck:  No JVD.  Lungs: Clear bilaterally to auscultation and percussion. Heart: HRRR . Normal S1 and S2 without gallops or murmurs.  Abdomen: Bowel sounds are positive, abdomen soft and non-tender  Msk:  Back normal, normal gait. Normal strength and tone for age. Extremities: No clubbing, cyanosis or edema.   Neuro: Alert and oriented X 3. Psych:  Good affect, responds appropriately   LABS: Basic Metabolic Panel: Recent Labs    04/18/18 1224  NA 138  K 4.3  CL 103  CO2 27  GLUCOSE 107*  BUN 15  CREATININE 1.22  CALCIUM 9.1   Liver Function Tests: No results for input(s): AST, ALT, ALKPHOS, BILITOT, PROT, ALBUMIN in the last 72 hours. No results for input(s): LIPASE, AMYLASE in the last 72 hours. CBC: Recent Labs    04/18/18 1224  WBC 7.0  HGB 13.4  HCT 38.9*  MCV 89.1  PLT 294   Cardiac Enzymes: Recent Labs    04/18/18 1553 04/18/18 1938 04/18/18 2325  TROPONINI 0.16* 0.13* 0.14*   BNP: Invalid input(s): POCBNP D-Dimer: No results for input(s): DDIMER in the last 72 hours. Hemoglobin A1C: No results for input(s): HGBA1C in the last 72 hours. Fasting Lipid Panel: No results for input(s): CHOL, HDL, LDLCALC, TRIG, CHOLHDL, LDLDIRECT in the last 72 hours. Thyroid Function Tests: No results for input(s): TSH,  T4TOTAL, T3FREE, THYROIDAB in the last 72 hours.  Invalid input(s): FREET3 Anemia Panel: No results for input(s): VITAMINB12, FOLATE, FERRITIN, TIBC, IRON, RETICCTPCT in the last 72 hours.  Dg Chest 2 View  Result Date: 04/18/2018 CLINICAL DATA:  Persistent shortness of breath. EXAM: CHEST - 2 VIEW COMPARISON:  Chest x-ray report dated March 31, 2018. FINDINGS: Mild cardiomegaly. Normal pulmonary vascularity. Mildly increased interstitial markings. Mild patchy opacity in the left lower lobe. No pneumothorax or pleural effusion. No acute osseous abnormality. IMPRESSION: 1. Cardiomegaly with mild interstitial pulmonary edema. 2. Patchy opacity in the left lower lobe could reflect atelectasis or infiltrate. Electronically Signed   By: Obie Dredge M.D.   On: 04/18/2018 12:57     Echo LVEF 45% by recent 2D echocardiogram 04/17/2018  TELEMETRY: Sinus rhythm at 65 bpm:  ASSESSMENT AND PLAN:  Active Problems:   CHF (congestive heart failure) (HCC)    1.  Acute on chronic diastolic congestive heart failure, much improved after diuresis 2.  Borderline elevated troponin, in the absence of chest pain or ECG changes, likely demand supply ischemia  Recommendations  1.  Agree with current therapy 2.  Continue diuresis 3.  Carefully monitor renal status 4.  Defer further cardiac diagnostics 5.  Follow-up with Dr. Lady Gary in 1 week   [name]  [name], MD, PhD, [facility] 04/19/2018 10:38 AM

## 2018-04-19 NOTE — Progress Notes (Signed)
[facility] [place] [facility]   PATIENT NAME: Todd Perez    MR#:  [id number]  DATE OF BIRTH:  August 18, 1940  SUBJECTIVE:  CHIEF COMPLAINT: Patient denies any chest pain or shortness of breath significantly better  REVIEW OF SYSTEMS:  CONSTITUTIONAL: No fever, fatigue or weakness.  EYES: No blurred or double vision.  EARS, NOSE, AND THROAT: No tinnitus or ear pain.  RESPIRATORY: No cough, improving shortness of breath, denies wheezing or hemoptysis.  CARDIOVASCULAR: No chest pain, orthopnea, edema.  GASTROINTESTINAL: No nausea, vomiting, diarrhea or abdominal pain.  GENITOURINARY: No dysuria, hematuria.  ENDOCRINE: No polyuria, nocturia,  HEMATOLOGY: No anemia, easy bruising or bleeding SKIN: No rash or lesion. MUSCULOSKELETAL: No joint pain or arthritis.   NEUROLOGIC: No tingling, numbness, weakness.  PSYCHIATRY: No anxiety or depression.   DRUG ALLERGIES:   Allergies  Allergen Reactions  . Benicar Hct [Olmesartan Medoxomil-Hctz] Itching  . Lisinopril Cough    VITALS:  Blood pressure 130/84, pulse 68, temperature 97.9 F (36.6 C), temperature source Oral, resp. rate 18, height 6\' 1"  (1.854 m), weight 91.7 kg, SpO2 99 %.  PHYSICAL EXAMINATION:  GENERAL:  77 y.o.-year-old patient lying in the bed with no acute distress.  EYES: Pupils equal, round, reactive to light and accommodation. No scleral icterus. Extraocular muscles intact.  HEENT: Head atraumatic, normocephalic. Oropharynx and nasopharynx clear.  NECK:  Supple, no jugular venous distention. No thyroid enlargement, no tenderness.  LUNGS: Normal breath sounds bilaterally, no wheezing, rales,rhonchi or crepitation. No use of accessory muscles of respiration.  CARDIOVASCULAR: S1, S2 normal. No murmurs, rubs, or gallops.  ABDOMEN: Soft, nontender, nondistended. Bowel sounds present. No organomegaly or mass.  EXTREMITIES: No pedal edema, cyanosis, or clubbing.  NEUROLOGIC: Cranial nerves II  through XII are intact. Muscle strength 5/5 in all extremities. Sensation intact. Gait not checked.  PSYCHIATRIC: The patient is alert and oriented x 3.  SKIN: No obvious rash, lesion, or ulcer.    LABORATORY PANEL:   CBC Recent Labs  Lab 04/18/18 1224  WBC 7.0  HGB 13.4  HCT 38.9*  PLT 294   ------------------------------------------------------------------------------------------------------------------  Chemistries  Recent Labs  Lab 04/18/18 1224  NA 138  K 4.3  CL 103  CO2 27  GLUCOSE 107*  BUN 15  CREATININE 1.22  CALCIUM 9.1   ------------------------------------------------------------------------------------------------------------------  Cardiac Enzymes Recent Labs  Lab 04/18/18 2325  TROPONINI 0.14*   ------------------------------------------------------------------------------------------------------------------  RADIOLOGY:  Dg Chest 2 View  Result Date: 04/18/2018 CLINICAL DATA:  Persistent shortness of breath. EXAM: CHEST - 2 VIEW COMPARISON:  Chest x-ray report dated March 31, 2018. FINDINGS: Mild cardiomegaly. Normal pulmonary vascularity. Mildly increased interstitial markings. Mild patchy opacity in the left lower lobe. No pneumothorax or pleural effusion. No acute osseous abnormality. IMPRESSION: 1. Cardiomegaly with mild interstitial pulmonary edema. 2. Patchy opacity in the left lower lobe could reflect atelectasis or infiltrate. Electronically Signed   By: Obie Dredge M.D.   On: 04/18/2018 12:57    EKG:   Orders placed or performed during the hospital encounter of 04/18/18  . EKG 12-Lead  . EKG 12-Lead  . ED EKG within 10 minutes  . ED EKG within 10 minutes    ASSESSMENT AND PLAN:    77 year old male with past medical history of hypertension, diabetes, hyperlipidemia who presents to the hospital due to shortness of breath and noted to be in congestive heart failure.  1.  CHF-acute systolic dysfunction.  Patient presents with  progressive shortness of breath  with chest x-ray findings suggestive of pulmonary edema. Clinically feeling better with the Lasix Monitor intake and output and daily weights  He had a recent echocardiogram done last week showing EF of 45% with mild pulmonary hypertension with mild tricuspid and mitral regurgitation. - We will diurese the patient with IV Lasix, follow I's and O's and daily weights. -Continue atenolol, losartan/HCTZ. -Appreciate Dr.Paraschos recommendations -Outpatient follow-up with Dr. Lady Gary in a week  2.  Elevated troponin-likely in the setting of supply demand ischemia from CHF. -We will observe on telemetry,  -Troponin 0 0.16-0.13-0.14 non-trending  3.  Essential hypertension-continue atenolol, losartan, HCTZ.  4.  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  Continue allopurinol.     All the records are reviewed and case discussed with Care Management/Social Workerr. Management plans discussed with the patient, family and they are in agreement.  CODE STATUS: Full code  TOTAL TIME TAKING CARE OF THIS PATIENT: .   POSSIBLE D/C IN 1 DAYS, DEPENDING ON CLINICAL CONDITION.  Note: This dictation was prepared with Dragon dictation along with smaller phrase technology. Any transcriptional errors that result from this process are unintentional.   Ramonita Lab M.D on 04/19/2018 at 2:18 PM  Between 7am to 6pm - Pager - [phone] After 6pm go to [email or web address] - password [email or web address] [facility]  [facility]  Office  [phone]  CC: Primary care physician; [name], MD

## 2018-04-19 NOTE — Plan of Care (Signed)
Pt. Denies any shortness of breath after walking to and from the bathroom.

## 2018-04-20 MED ORDER — ATENOLOL 25 MG PO TABS: 25.0000 mg | ORAL_TABLET | Freq: Every day | ORAL | 0 refills | Status: DC

## 2018-04-20 MED ORDER — FUROSEMIDE 20 MG PO TABS: 20.0000 mg | ORAL_TABLET | Freq: Two times a day (BID) | ORAL | 0 refills | Status: DC

## 2018-04-20 NOTE — Care Management CC44 (Signed)
Condition Code 44 Documentation Completed  Patient Details  Name: Todd Perez MRN: [id number] Date of Birth: 17-May-1941   Condition Code 44 given:  Yes Patient signature on Condition Code 44 notice:  Yes Documentation of 2 MD's agreement:  Yes Code 44 added to claim:  Yes    Sherren Kerns, RN 04/20/2018, 2:00 PM

## 2018-04-20 NOTE — Care Management Note (Signed)
Case Management Note  Patient Details  Name: Todd Perez MRN: [id number] Date of Birth: Sep 24, 1940  Subjective/Objective:        RNCM in room to assess prior to discharge.  Patient lives at home with wife and is independent.  No current services in the home.  On RA.  Denies transportation issues or difficulty affording medications.  PCP current with Dr. Delia Chimes.    Action/Plan: Heart failure appointment on Sept. 23rd.  Cardiology appointment scheduled for this Wednesday.  Heart Failure booklet given to patient by RN.  Patient has a functioning scale at home and states understanding of weighing everyday.   No further needs at this time.       Expected Discharge Date:  04/20/18               Expected Discharge Plan:  Home/Self Care  In-House Referral:     Discharge planning Services     Post Acute Care Choice:    Choice offered to:     DME Arranged:    DME Agency:     HH Arranged:    HH Agency:     Status of Service:  Completed, signed off  If discussed at [employer] of Stay Meetings, dates discussed:    Additional Comments:  Sherren Kerns, RN 04/20/2018, 12:09 PM

## 2018-04-20 NOTE — Discharge Summary (Signed)
[facility] [place] at [facility]   PATIENT NAME: Todd Perez    MR#:  [id number]  DATE OF BIRTH:  January 20, 1941  DATE OF ADMISSION:  04/18/2018 ADMITTING PHYSICIAN: Houston Siren, MD  DATE OF DISCHARGE: 04/20/18  PRIMARY CARE PHYSICIAN: [name], MD    ADMISSION DIAGNOSIS:  SOB (shortness of breath) [R06.02] Troponin level elevated [R74.8]  DISCHARGE DIAGNOSIS:  Acute CHF  SECONDARY DIAGNOSIS:   Past Medical History:  Diagnosis Date  . Diabetes mellitus without complication (HCC)   . Hyperlipidemia   . Hypertension   . Neuromuscular disorder (HCC)   . Peripheral neuropathy     HOSPITAL COURSE:   Todd Perez  is a 77 y.o. male with a known history of diabetes, essential hypertension, hyperlipidemia, peripheral neuropathy who presents to the hospital complaining of shortness of breath.  Patient says he has been having shortness of breath now ongoing for the past month but this past week it has gotten significantly worse.  Shortness of breath worse with exertion rather than at rest.  He denies nausea, chest pain, but admits to some intermittent paroxysmal nocturnal dyspnea but no orthopnea.  Patient denies any cough, congestion, fever, chills, sick contacts.  Since he was having progressive shortness of breath over the past week he was seen by his cardiologist last week and had a echocardiogram done, the echo showed EF of 45% with some mild mitral regurgitation and tricuspid regurgitation with some mild pulmonary hypertension.  Patient has not received the results of this echocardiogram yet and was supposed to see his cardiologist next week but due to his symptoms above he presented to the ER for further evaluation.  In the ER patient underwent a chest x-ray which showed evidence of mild interstitial edema and also mildly elevated troponin.  Hospitalist services were contacted for admission   1. CHF-acute systolic dysfunction. Patient presents with  progressive shortness of breath with chest x-ray findings suggestive of pulmonary edema. Clinically feeling better with the Lasix Monitor intake and output and daily weights, He had a recent echocardiogram done last week showing EF of 45% with mild pulmonary hypertension with mild tricuspid and mitral regurgitation. - We will diurese the patient with IV Lasix, follow I's and O's and daily weights. -Continue atenolol, losartan/HCTZ. -Appreciate Dr.Paraschos recommendations -Outpatient follow-up with Dr. Lady Gary in a week -Outpatient CHF clinic, Daily weight monitoring, patient refused home health with CHF as he is working  2. Elevated troponin-likely in the setting of supply demand ischemia from CHF. No cardiac interventions needed with the cardiology  -Troponin 0 0.16-0.13-0.14 non-trending  3. Essential hypertension-continue atenolol, losartan, HCTZ.  4. 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 Continue allopurinol.  DISCHARGE CONDITIONS:   Stable   CONSULTS OBTAINED:     PROCEDURES none  DRUG ALLERGIES:   Allergies  Allergen Reactions  . Benicar Hct [Olmesartan Medoxomil-Hctz] Itching  . Lisinopril Cough    DISCHARGE MEDICATIONS:   Allergies as of 04/20/2018      Reactions   Benicar Hct [olmesartan Medoxomil-hctz] Itching   Lisinopril Cough      Medication List    TAKE these medications   allopurinol 100 MG tablet Commonly known as:  ZYLOPRIM Take 100 mg by mouth daily as needed (gout prevention).   atenolol 25 MG tablet Commonly known as:  TENORMIN Take 1 tablet (25 mg total) by mouth daily. Start taking on:  04/21/2018 What changed:    medication strength  how much to take   furosemide 20 MG  tablet Commonly known as:  LASIX Take 1 tablet (20 mg total) by mouth 2 (two) times daily.   losartan-hydrochlorothiazide 100-25 MG tablet Commonly known as:  HYZAAR Take 1 tablet by mouth daily.        DISCHARGE INSTRUCTIONS:   Follow-up with primary care  physician in 3 to 4 days Follow-up with cardiology Dr. Lady Gary in a week Home health with CHF vest DIET:  Cardiac diet  DISCHARGE CONDITION:  Fair  ACTIVITY:  Activity as tolerated  OXYGEN:  Home Oxygen: No.   Oxygen Delivery: room air  DISCHARGE LOCATION:  home   If you experience worsening of your admission symptoms, develop shortness of breath, life threatening emergency, suicidal or homicidal thoughts you must seek medical attention immediately by calling 911 or calling your MD immediately  if symptoms less severe.  You Must read complete instructions/literature along with all the possible adverse reactions/side effects for all the Medicines you take and that have been prescribed to you. Take any new Medicines after you have completely understood and accpet all the possible adverse reactions/side effects.   Please note  You were cared for by a hospitalist during your hospital stay. If you have any questions about your discharge medications or the care you received while you were in the hospital after you are discharged, you can call the unit and asked to speak with the hospitalist on call if the hospitalist that took care of you is not available. Once you are discharged, your primary care physician will handle any further medical issues. Please note that NO REFILLS for any discharge medications will be authorized once you are discharged, as it is imperative that you return to your primary care physician (or establish a relationship with a primary care physician if you do not have one) for your aftercare needs so that they can reassess your need for medications and monitor your lab values.     Today  Chief Complaint  Patient presents with  . Shortness of Breath   Patient shortness of breath significantly improved and leg swelling on the better.  Wants to go home.  Okay to discharge patient from cardiology standpoint.  ROS:  CONSTITUTIONAL: Denies fevers, chills. Denies any  fatigue, weakness.  EYES: Denies blurry vision, double vision, eye pain. EARS, NOSE, THROAT: Denies tinnitus, ear pain, hearing loss. RESPIRATORY: Denies cough, wheeze, shortness of breath.  CARDIOVASCULAR: Denies chest pain, palpitations, edema.  GASTROINTESTINAL: Denies nausea, vomiting, diarrhea, abdominal pain. Denies bright red blood per rectum. GENITOURINARY: Denies dysuria, hematuria. ENDOCRINE: Denies nocturia or thyroid problems. HEMATOLOGIC AND LYMPHATIC: Denies easy bruising or bleeding. SKIN: Denies rash or lesion. MUSCULOSKELETAL: Denies pain in neck, back, shoulder, knees, hips or arthritic symptoms.  NEUROLOGIC: Denies paralysis, paresthesias.  PSYCHIATRIC: Denies anxiety or depressive symptoms.   VITAL SIGNS:  Blood pressure 118/79, pulse 64, temperature 98.4 F (36.9 C), temperature source Oral, resp. rate 18, height 6\' 1"  (1.854 m), weight 89 kg, SpO2 97 %.  I/O:    Intake/Output Summary (Last 24 hours) at 04/20/2018 1135 Last data filed at 04/20/2018 0742 Gross per 24 hour  Intake -  Output 3100 ml  Net -3100 ml    PHYSICAL EXAMINATION:  GENERAL:  77 y.o.-year-old patient lying in the bed with no acute distress.  EYES: Pupils equal, round, reactive to light and accommodation. No scleral icterus. Extraocular muscles intact.  HEENT: Head atraumatic, normocephalic. Oropharynx and nasopharynx clear.  NECK:  Supple, no jugular venous distention. No thyroid enlargement, no tenderness.  LUNGS:  Normal breath sounds bilaterally, no wheezing, rales,rhonchi or crepitation. No use of accessory muscles of respiration.  CARDIOVASCULAR: S1, S2 normal. No murmurs, rubs, or gallops.  ABDOMEN: Soft, non-tender, non-distended. Bowel sounds present.  EXTREMITIES: trace pedal edema, cyanosis, or clubbing.  NEUROLOGIC: Cranial nerves II through XII are intact. Muscle strength 5/5 in all extremities. Sensation intact. Gait not checked.  PSYCHIATRIC: The patient is alert and oriented x  3.  SKIN: No obvious rash, lesion, or ulcer.   DATA REVIEW:   CBC Recent Labs  Lab 04/18/18 1224  WBC 7.0  HGB 13.4  HCT 38.9*  PLT 294    Chemistries  Recent Labs  Lab 04/18/18 1224  NA 138  K 4.3  CL 103  CO2 27  GLUCOSE 107*  BUN 15  CREATININE 1.22  CALCIUM 9.1    Cardiac Enzymes Recent Labs  Lab 04/18/18 2325  TROPONINI 0.14*    Microbiology Results  No results found for this or any previous visit.  RADIOLOGY:  Dg Chest 2 View  Result Date: 04/18/2018 CLINICAL DATA:  Persistent shortness of breath. EXAM: CHEST - 2 VIEW COMPARISON:  Chest x-ray report dated March 31, 2018. FINDINGS: Mild cardiomegaly. Normal pulmonary vascularity. Mildly increased interstitial markings. Mild patchy opacity in the left lower lobe. No pneumothorax or pleural effusion. No acute osseous abnormality. IMPRESSION: 1. Cardiomegaly with mild interstitial pulmonary edema. 2. Patchy opacity in the left lower lobe could reflect atelectasis or infiltrate. Electronically Signed   By: Obie Dredge M.D.   On: 04/18/2018 12:57    EKG:   Orders placed or performed during the hospital encounter of 04/18/18  . EKG 12-Lead  . EKG 12-Lead  . ED EKG within 10 minutes  . ED EKG within 10 minutes      Management plans discussed with the patient, family and they are in agreement.  CODE STATUS:     Code Status Orders  (From admission, onward)         Start     Ordered   04/18/18 1532  Full code  Continuous     04/18/18 1531        Code Status History    This patient has a current code status but no historical code status.      TOTAL TIME TAKING CARE OF THIS PATIENT: 43 minutes.   Note: This dictation was prepared with Dragon dictation along with smaller phrase technology. Any transcriptional errors that result from this process are unintentional.   @[facility] @  on 04/20/2018 at 11:35 AM  Between 7am to 6pm - Pager - [phone]  After 6pm go to [email or web address] - password [email or web address]  [facility]  [facility]  Office  [phone]  CC: Primary care physician; [name], MD

## 2018-04-20 NOTE — Care Management CC44 (Signed)
Condition Code 44 Documentation Completed  Patient Details  Name: Todd Perez MRN: [id number] Date of Birth: 02-09-1941   Condition Code 44 given:  Yes Patient signature on Condition Code 44 notice:  Yes Documentation of 2 MD's agreement:  Yes Code 44 added to claim:  Yes    Sherren Kerns, RN 04/20/2018, 1:24 PM

## 2018-04-20 NOTE — Plan of Care (Signed)
  Problem: Clinical Measurements: Goal: Cardiovascular complication will be avoided Outcome: Progressing   Problem: Activity: Goal: Risk for activity intolerance will decrease Outcome: Progressing   Problem: Education: Goal: Ability to demonstrate management of disease process will improve Outcome: Progressing Goal: Ability to verbalize understanding of medication therapies will improve Outcome: Progressing Goal: Individualized Educational Video(s) Outcome: Progressing   Problem: Cardiac: Goal: Ability to achieve and maintain adequate cardiopulmonary perfusion will improve Outcome: Progressing

## 2018-04-20 NOTE — Discharge Instructions (Signed)
Follow-up with primary care physician in 3 to 4 days Follow-up with cardiology Dr. Lady Gary in a week Home health with CHF vest

## 2018-04-20 NOTE — Plan of Care (Signed)
  Problem: Education: Goal: Knowledge of General Education information will improve Description Including pain rating scale, medication(s)/side effects and non-pharmacologic comfort measures Outcome: Progressing   Problem: Health Behavior/Discharge Planning: Goal: Ability to manage health-related needs will improve Outcome: Progressing   Problem: Clinical Measurements: Goal: Ability to maintain clinical measurements within normal limits will improve Outcome: Progressing Goal: Diagnostic test results will improve Outcome: Progressing Goal: Respiratory complications will improve Outcome: Progressing   Problem: Pain Managment: Goal: General experience of comfort will improve Outcome: Progressing   

## 2018-04-20 NOTE — Progress Notes (Signed)
Pt discharged home today, refused wheelchair, and will drive self home, he has no complaints and has been provided with follow up appointments and discharge instructions. Pt educated on Living better with Heart failure book today as well he refused to watch EMMI videos. He understands that he must change his diet and the importance of weighing himself each morning.

## 2018-04-20 NOTE — Care Management Obs Status (Signed)
MEDICARE OBSERVATION STATUS NOTIFICATION   Patient Details  Name: Todd Perez MRN: [id number] Date of Birth: [birth year]   Medicare Observation Status Notification Given:  Yes    Sherren Kerns, RN 04/20/2018, 2:00 PM

## 2018-04-20 NOTE — Care Management (Signed)
Code 44 completed

## 2018-05-06 ENCOUNTER — Telehealth: Payer: Self-pay | Admitting: Family

## 2018-05-06 ENCOUNTER — Ambulatory Visit: Payer: Medicare Other | Admitting: Family

## 2018-05-06 NOTE — Telephone Encounter (Signed)
Patient did not show for his Heart Failure Clinic appointment on 05/06/18. Will attempt to reschedule.

## 2018-07-06 ENCOUNTER — Other Ambulatory Visit: Payer: Self-pay | Admitting: Cardiology

## 2018-07-06 DIAGNOSIS — R0789 Other chest pain: Secondary | ICD-10-CM

## 2018-07-08 ENCOUNTER — Encounter
Admission: RE | Disposition: A | Discharge status: Home / Self Care | Payer: Self-pay | Source: Ambulatory Visit | Attending: Cardiology

## 2018-07-08 ENCOUNTER — Ambulatory Visit
Admission: RE | Admit: 2018-07-08 | Discharge: 2018-07-08 | Disposition: A | Discharge status: Home / Self Care | Payer: Medicare Other | Source: Ambulatory Visit | Attending: Cardiology | Admitting: Cardiology

## 2018-07-08 ENCOUNTER — Other Ambulatory Visit: Payer: Self-pay

## 2018-07-08 DIAGNOSIS — R0789 Other chest pain: Secondary | ICD-10-CM

## 2018-07-08 DIAGNOSIS — I110 Hypertensive heart disease with heart failure: Secondary | ICD-10-CM | POA: Diagnosis not present

## 2018-07-08 DIAGNOSIS — I429 Cardiomyopathy, unspecified: Secondary | ICD-10-CM | POA: Diagnosis not present

## 2018-07-08 DIAGNOSIS — M1990 Unspecified osteoarthritis, unspecified site: Secondary | ICD-10-CM | POA: Insufficient documentation

## 2018-07-08 DIAGNOSIS — G43909 Migraine, unspecified, not intractable, without status migrainosus: Secondary | ICD-10-CM | POA: Insufficient documentation

## 2018-07-08 DIAGNOSIS — I2582 Chronic total occlusion of coronary artery: Secondary | ICD-10-CM | POA: Diagnosis not present

## 2018-07-08 DIAGNOSIS — M109 Gout, unspecified: Secondary | ICD-10-CM | POA: Diagnosis not present

## 2018-07-08 DIAGNOSIS — K219 Gastro-esophageal reflux disease without esophagitis: Secondary | ICD-10-CM | POA: Diagnosis not present

## 2018-07-08 DIAGNOSIS — I272 Pulmonary hypertension, unspecified: Secondary | ICD-10-CM | POA: Diagnosis not present

## 2018-07-08 DIAGNOSIS — Z87891 Personal history of nicotine dependence: Secondary | ICD-10-CM | POA: Diagnosis not present

## 2018-07-08 DIAGNOSIS — Z8249 Family history of ischemic heart disease and other diseases of the circulatory system: Secondary | ICD-10-CM | POA: Diagnosis not present

## 2018-07-08 DIAGNOSIS — I2510 Atherosclerotic heart disease of native coronary artery without angina pectoris: Secondary | ICD-10-CM | POA: Insufficient documentation

## 2018-07-08 DIAGNOSIS — R9430 Abnormal result of cardiovascular function study, unspecified: Secondary | ICD-10-CM | POA: Diagnosis present

## 2018-07-08 DIAGNOSIS — I5020 Unspecified systolic (congestive) heart failure: Secondary | ICD-10-CM | POA: Diagnosis not present

## 2018-07-08 HISTORY — PX: LEFT HEART CATH AND CORONARY ANGIOGRAPHY: CATH118249

## 2018-07-08 SURGERY — LEFT HEART CATH AND CORONARY ANGIOGRAPHY
Anesthesia: Moderate Sedation | Laterality: Left

## 2018-07-08 MED ORDER — SODIUM CHLORIDE 0.9% FLUSH: 3.0000 mL | INTRAVENOUS | Status: DC | PRN

## 2018-07-08 MED ORDER — MIDAZOLAM HCL 2 MG/2ML IJ SOLN
INTRAMUSCULAR | Status: AC
  Filled 2018-07-08: qty 2

## 2018-07-08 MED ORDER — SODIUM CHLORIDE 0.9 % WEIGHT BASED INFUSION: 1.0000 mL/kg/h | INTRAVENOUS | Status: DC

## 2018-07-08 MED ORDER — FENTANYL CITRATE (PF) 100 MCG/2ML IJ SOLN
INTRAMUSCULAR | Status: AC
  Filled 2018-07-08: qty 2

## 2018-07-08 MED ORDER — IOPAMIDOL (ISOVUE-300) INJECTION 61%
INTRAVENOUS | Status: DC | PRN
  Administered 2018-07-08: 65 mL via INTRA_ARTERIAL

## 2018-07-08 MED ORDER — SODIUM CHLORIDE 0.9% FLUSH: 3.0000 mL | Freq: Two times a day (BID) | INTRAVENOUS | Status: DC

## 2018-07-08 MED ORDER — FENTANYL CITRATE (PF) 100 MCG/2ML IJ SOLN
INTRAMUSCULAR | Status: DC | PRN
  Administered 2018-07-08: 25 ug via INTRAVENOUS

## 2018-07-08 MED ORDER — ASPIRIN 81 MG PO CHEW
81.0000 mg | CHEWABLE_TABLET | ORAL | Status: AC
  Administered 2018-07-08: 81 mg via ORAL

## 2018-07-08 MED ORDER — SODIUM CHLORIDE 0.9 % IV SOLN: 250.0000 mL | INTRAVENOUS | Status: DC | PRN

## 2018-07-08 MED ORDER — SODIUM CHLORIDE 0.9 % WEIGHT BASED INFUSION
3.0000 mL/kg/h | INTRAVENOUS | Status: AC
  Administered 2018-07-08: 3 mL/kg/h via INTRAVENOUS

## 2018-07-08 MED ORDER — HEPARIN SODIUM (PORCINE) 1000 UNIT/ML IJ SOLN
INTRAMUSCULAR | Status: DC | PRN
  Administered 2018-07-08: 4600 [IU] via INTRAVENOUS

## 2018-07-08 MED ORDER — ASPIRIN 81 MG PO CHEW
CHEWABLE_TABLET | ORAL | Status: AC
  Administered 2018-07-08: 81 mg via ORAL
  Filled 2018-07-08: qty 1

## 2018-07-08 MED ORDER — VERAPAMIL HCL 2.5 MG/ML IV SOLN
INTRAVENOUS | Status: AC
  Filled 2018-07-08: qty 2

## 2018-07-08 MED ORDER — VERAPAMIL HCL 2.5 MG/ML IV SOLN
INTRAVENOUS | Status: DC | PRN
  Administered 2018-07-08: 2.5 mg via INTRA_ARTERIAL

## 2018-07-08 MED ORDER — MIDAZOLAM HCL 2 MG/2ML IJ SOLN
INTRAMUSCULAR | Status: DC | PRN
  Administered 2018-07-08 (×2): 1 mg via INTRAVENOUS

## 2018-07-08 MED ORDER — HEPARIN (PORCINE) IN NACL 1000-0.9 UT/500ML-% IV SOLN
INTRAVENOUS | Status: AC
  Filled 2018-07-08: qty 1000

## 2018-07-08 MED ORDER — ONDANSETRON HCL 4 MG/2ML IJ SOLN: 4.0000 mg | Freq: Four times a day (QID) | INTRAMUSCULAR | Status: DC | PRN

## 2018-07-08 MED ORDER — ACETAMINOPHEN 325 MG PO TABS: 650.0000 mg | ORAL_TABLET | ORAL | Status: DC | PRN

## 2018-07-08 MED ORDER — HEPARIN SODIUM (PORCINE) 1000 UNIT/ML IJ SOLN
INTRAMUSCULAR | Status: AC
  Filled 2018-07-08: qty 1

## 2018-07-08 SURGICAL SUPPLY — 7 items
CATH OPTITORQUE JACKY 4.0 5F (CATHETERS) ×2 IMPLANT
DEVICE RAD TR BAND REGULAR (VASCULAR PRODUCTS) ×2 IMPLANT
GLIDESHEATH SLEND A-KIT 6F 22G (SHEATH) ×2 IMPLANT
KIT MANI 3VAL PERCEP (MISCELLANEOUS) ×2 IMPLANT
PACK CARDIAC CATH (CUSTOM PROCEDURE TRAY) ×2 IMPLANT
WIRE HITORQ VERSACORE ST 145CM (WIRE) ×2 IMPLANT
WIRE ROSEN-J .035X260CM (WIRE) ×2 IMPLANT

## 2018-07-08 NOTE — H&P (Signed)
Chief Complaint: Chief Complaint  Patient presents with  . Shortness of Breath  Date of Service: 06/29/2018 Date of Birth: 09-30-1940 PCP: Sandie Ano, MD  History of Present Illness: Todd Perez is a 77 y.o.male patient with a past medical history significant for hypertension and recently diagnosed reduced LV systolic function who presents for a follow up visit. Breathing initially improved on Lasix, but has gotten worse lately. Admits to a one week history of exertional shortness of breath especially when going up stairs or climbing a hill. Was scheduled for a cardiac CT, but this has not been done. Denies chest pain or palpitations. Lower extremity has improved on Lasix. Denies presyncope or syncopal episodes.   Echocardiogram on 04/16/18 revealed mild LV systolic function with an EF estimated at 45% with mild to moderate MR, mild TR, AR, and PR, and moderate pulmonary hypertension. Stress test revealed inferior and inferoseptal hypoperfusion with stress and partial redistribution with rest. Discussed options to assess reduced LV function and abnormal stress test and Todd Perez agrees to proceed with cardiac catheterization.   Past Medical and Surgical History  Past Medical History Past Medical History:  Diagnosis Date  . Allergic state  . Arthritis  . GERD (gastroesophageal reflux disease)  . Gout  . Hypertension  . Migraines   Past Surgical History He has a past surgical history that includes Thyroid nodule removal, benign (06/2009); COLONOSCOPY (12/13/1999); Colonoscopy (01/28/2002); Colonoscopy (04/24/2004); Colonoscopy (09/13/2009); and Colonoscopy (02/06/2015).   Medications and Allergies  Current Medications  Current Outpatient Medications  Medication Sig Dispense Refill  . allopurinol (ZYLOPRIM) 100 MG tablet TAKE 1 TABLET BY MOUTH ONCE DAILY 90 tablet 3  . FUROsemide (LASIX) 20 MG tablet Take 1 tablet (20 mg total) by mouth 2 (two) times daily 60 tablet 11  .  losartan-hydrochlorothiazide (HYZAAR) 100-25 mg tablet TAKE 1 TABLET BY MOUTH ONCE DAILY (Patient taking differently: Take 1 tablet by mouth once daily ) 90 tablet 3  . albuterol 90 mcg/actuation inhaler Inhale 2 inhalations into the lungs every 6 (six) hours as needed for Shortness of Breath (Patient not taking: Reported on 06/29/2018 ) 8.5 g 3  . metoprolol succinate (TOPROL-XL) 25 MG XL tablet Take 1 tablet (25 mg total) by mouth once daily 30 tablet 11   No current facility-administered medications for this visit.   Allergies: Benicar hct [olmesartan-hydrochlorothiazide] and Lisinopril  Social and Family History  Social History reports that he quit smoking about 29 years ago. He has never used smokeless tobacco. He reports that he does not drink alcohol or use drugs.  Family History Family History  Problem Relation Age of Onset  . Heart disease Mother  . Emphysema Father  . Leukemia Other  . High blood pressure (Hypertension) Other   Review of Systems   Review of Systems: The patient denies chest pain, shortness of breath, orthopnea, paroxysmal nocturnal dyspnea, pedal edema, palpitations, heart racing, fatigue, dizziness, lightheadedness, presyncope, syncope, leg pain, leg cramping. Review of 12 Systems is negative except as described in HPI.   Physical Examination   Vitals:BP 106/74  Pulse 67  Ht 185.4 cm (6\' 1" )  Wt 91.1 kg (200 lb 13.4 oz)  SpO2 99%  BMI 26.50 kg/m  Ht:185.4 cm (6\' 1" ) Wt:91.1 kg (200 lb 13.4 oz) JEH:UDJS surface area is 2.17 meters squared. Body mass index is 26.5 kg/m.  General: Well developed, well nourished. In no acute distress HEENT: Pupils equally reactive to light and accomodation  Neck: Supple without thyromegaly, or goiter.  Carotid pulses 2+. No carotid bruits present.  Pulmonary: Clear to auscultation bilaterally; no wheezes, rales, rhonchi Cardiovascular: Regular rate and rhythm. No gallops, murmurs or rubs Gastrointestinal: Soft  nontender, nondistended, with normal bowel sounds Extremities: No cyanosis or clubbing. Trace peripheral edema bilaterally Peripheral Pulses: 2+ in upper extremities, 2+ in lower extremities  Neurology: Alert and oriented X3 Pysch: Good affect. Responds appropriately  Assessment   77 y.o. male with  1. Hypertension, unspecified type  2. Systolic congestive heart failure, unspecified HF chronicity (CMS-HCC)  3. Shortness of breath   Plan  1. Hypertension  -Continue losartan-HCTZ 100-25mg  once daily  -Discontinue atenolol; begin taking metoprolol 25mg  daily  2. Systolic congestive heart failure  -Continue losartan-HCTZ -Atenolol discontinued and metoprolol started at today's visit  -Cardiac catheterization scheduled for further evaluation  3. Shortness of breath  -Continue Lasix 40mg  once daily   Orders Placed This Encounter  Procedures  . CBC w/auto Differential (5 Part)  . Basic Metabolic Panel (BMP)   Return after cath.  Darlin Priestly Suhan Paci MD  Pt seen and examined. No change from above

## 2018-08-25 ENCOUNTER — Emergency Department: Payer: Medicare Other

## 2018-08-25 ENCOUNTER — Other Ambulatory Visit: Payer: Self-pay

## 2018-08-25 ENCOUNTER — Inpatient Hospital Stay (HOSPITAL_COMMUNITY)
Admit: 2018-08-25 | Discharge: 2018-08-25 | Disposition: A | Discharge status: Home / Self Care | Payer: Medicare Other | Attending: Internal Medicine | Admitting: Internal Medicine

## 2018-08-25 ENCOUNTER — Inpatient Hospital Stay
Admission: EM | Admit: 2018-08-25 | Discharge: 2018-08-27 | DRG: 194 | Disposition: A | Discharge status: Home / Self Care | Payer: Medicare Other | Attending: Internal Medicine | Admitting: Internal Medicine

## 2018-08-25 DIAGNOSIS — Z841 Family history of disorders of kidney and ureter: Secondary | ICD-10-CM

## 2018-08-25 DIAGNOSIS — Z87891 Personal history of nicotine dependence: Secondary | ICD-10-CM

## 2018-08-25 DIAGNOSIS — G629 Polyneuropathy, unspecified: Secondary | ICD-10-CM | POA: Diagnosis present

## 2018-08-25 DIAGNOSIS — Z825 Family history of asthma and other chronic lower respiratory diseases: Secondary | ICD-10-CM | POA: Diagnosis not present

## 2018-08-25 DIAGNOSIS — I370 Nonrheumatic pulmonary valve stenosis: Secondary | ICD-10-CM

## 2018-08-25 DIAGNOSIS — R0600 Dyspnea, unspecified: Secondary | ICD-10-CM

## 2018-08-25 DIAGNOSIS — M109 Gout, unspecified: Secondary | ICD-10-CM | POA: Diagnosis present

## 2018-08-25 DIAGNOSIS — I712 Thoracic aortic aneurysm, without rupture: Secondary | ICD-10-CM | POA: Diagnosis present

## 2018-08-25 DIAGNOSIS — I255 Ischemic cardiomyopathy: Secondary | ICD-10-CM | POA: Diagnosis present

## 2018-08-25 DIAGNOSIS — R042 Hemoptysis: Secondary | ICD-10-CM | POA: Diagnosis present

## 2018-08-25 DIAGNOSIS — E785 Hyperlipidemia, unspecified: Secondary | ICD-10-CM | POA: Diagnosis present

## 2018-08-25 DIAGNOSIS — Z801 Family history of malignant neoplasm of trachea, bronchus and lung: Secondary | ICD-10-CM

## 2018-08-25 DIAGNOSIS — Z888 Allergy status to other drugs, medicaments and biological substances status: Secondary | ICD-10-CM | POA: Diagnosis not present

## 2018-08-25 DIAGNOSIS — I2510 Atherosclerotic heart disease of native coronary artery without angina pectoris: Secondary | ICD-10-CM | POA: Diagnosis present

## 2018-08-25 DIAGNOSIS — N179 Acute kidney failure, unspecified: Secondary | ICD-10-CM | POA: Diagnosis present

## 2018-08-25 DIAGNOSIS — E860 Dehydration: Secondary | ICD-10-CM | POA: Diagnosis present

## 2018-08-25 DIAGNOSIS — I509 Heart failure, unspecified: Secondary | ICD-10-CM | POA: Diagnosis present

## 2018-08-25 DIAGNOSIS — E871 Hypo-osmolality and hyponatremia: Secondary | ICD-10-CM | POA: Diagnosis not present

## 2018-08-25 DIAGNOSIS — I700 Atherosclerosis of aorta: Secondary | ICD-10-CM | POA: Diagnosis present

## 2018-08-25 DIAGNOSIS — J189 Pneumonia, unspecified organism: Secondary | ICD-10-CM

## 2018-08-25 DIAGNOSIS — I110 Hypertensive heart disease with heart failure: Secondary | ICD-10-CM | POA: Diagnosis present

## 2018-08-25 DIAGNOSIS — I340 Nonrheumatic mitral (valve) insufficiency: Secondary | ICD-10-CM

## 2018-08-25 DIAGNOSIS — E119 Type 2 diabetes mellitus without complications: Secondary | ICD-10-CM | POA: Diagnosis present

## 2018-08-25 DIAGNOSIS — R0602 Shortness of breath: Secondary | ICD-10-CM | POA: Diagnosis present

## 2018-08-25 DIAGNOSIS — J181 Lobar pneumonia, unspecified organism: Secondary | ICD-10-CM

## 2018-08-25 HISTORY — DX: Ischemic cardiomyopathy: I25.5

## 2018-08-25 HISTORY — DX: Atherosclerotic heart disease of native coronary artery without angina pectoris: I25.10

## 2018-08-25 LAB — BASIC METABOLIC PANEL
Anion gap: 11 (ref 5–15)
BUN: 33 mg/dL — AB (ref 8–23)
CALCIUM: 8.7 mg/dL — AB (ref 8.9–10.3)
CHLORIDE: 98 mmol/L (ref 98–111)
CO2: 24 mmol/L (ref 22–32)
CREATININE: 1.8 mg/dL — AB (ref 0.61–1.24)
GFR calc non Af Amer: 35 mL/min — ABNORMAL LOW (ref >60)
GFR, EST AFRICAN AMERICAN: 41 mL/min — AB (ref >60)
Glucose, Bld: 125 mg/dL — ABNORMAL HIGH (ref 70–99)
Potassium: 3.7 mmol/L (ref 3.5–5.1)
SODIUM: 133 mmol/L — AB (ref 135–145)

## 2018-08-25 LAB — CBC
HEMATOCRIT: 36.7 % — AB (ref 39.0–52.0)
Hemoglobin: 12.2 g/dL — ABNORMAL LOW (ref 13.0–17.0)
MCH: 30.1 pg (ref 26.0–34.0)
MCHC: 33.2 g/dL (ref 30.0–36.0)
MCV: 90.6 fL (ref 80.0–100.0)
NRBC: 0 % (ref 0.0–0.2)
PLATELETS: 230 10*3/uL (ref 150–400)
RBC: 4.05 MIL/uL — ABNORMAL LOW (ref 4.22–5.81)
RDW: 13.3 % (ref 11.5–15.5)
WBC: 9.8 10*3/uL (ref 4.0–10.5)

## 2018-08-25 LAB — HEMOGLOBIN A1C
Hgb A1c MFr Bld: 5.8 % — ABNORMAL HIGH (ref 4.8–5.6)
Mean Plasma Glucose: 119.76 mg/dL

## 2018-08-25 LAB — TROPONIN I
Troponin I: 0.2 ng/mL (ref <0.03)
Troponin I: 0.23 ng/mL (ref <0.03)
Troponin I: 0.22 ng/mL (ref <0.03)

## 2018-08-25 LAB — TSH: TSH: 1.659 u[IU]/mL (ref 0.350–4.500)

## 2018-08-25 MED ORDER — ACETAMINOPHEN 650 MG RE SUPP: 650.0000 mg | Freq: Four times a day (QID) | RECTAL | Status: DC | PRN

## 2018-08-25 MED ORDER — ONDANSETRON HCL 4 MG/2ML IJ SOLN: 4.0000 mg | Freq: Four times a day (QID) | INTRAMUSCULAR | Status: DC | PRN

## 2018-08-25 MED ORDER — SODIUM CHLORIDE 0.9 % IV SOLN
1.0000 g | INTRAVENOUS | Status: DC
  Administered 2018-08-25 – 2018-08-26 (×2): 1 g via INTRAVENOUS
  Filled 2018-08-25 (×2): qty 10
  Filled 2018-08-25: qty 1

## 2018-08-25 MED ORDER — GUAIFENESIN-DM 100-10 MG/5ML PO SYRP
5.0000 mL | ORAL_SOLUTION | ORAL | Status: DC | PRN
  Administered 2018-08-26: 5 mL via ORAL
  Filled 2018-08-25: qty 5

## 2018-08-25 MED ORDER — SODIUM CHLORIDE 0.9 % IV SOLN
INTRAVENOUS | Status: DC
  Administered 2018-08-25: 18:00:00 via INTRAVENOUS

## 2018-08-25 MED ORDER — ONDANSETRON HCL 4 MG PO TABS: 4.0000 mg | ORAL_TABLET | Freq: Four times a day (QID) | ORAL | Status: DC | PRN

## 2018-08-25 MED ORDER — SODIUM CHLORIDE 0.9 % IV BOLUS
500.0000 mL | Freq: Once | INTRAVENOUS | Status: AC
  Administered 2018-08-25: 500 mL via INTRAVENOUS

## 2018-08-25 MED ORDER — ACETAMINOPHEN 325 MG PO TABS
650.0000 mg | ORAL_TABLET | Freq: Four times a day (QID) | ORAL | Status: DC | PRN
  Administered 2018-08-26 (×2): 650 mg via ORAL
  Filled 2018-08-25 (×2): qty 2

## 2018-08-25 MED ORDER — LEVOFLOXACIN 750 MG PO TABS: 750.0000 mg | ORAL_TABLET | Freq: Every day | ORAL | 0 refills | Status: DC

## 2018-08-25 MED ORDER — LEVOFLOXACIN IN D5W 750 MG/150ML IV SOLN
750.0000 mg | Freq: Once | INTRAVENOUS | Status: AC
  Administered 2018-08-25: 750 mg via INTRAVENOUS
  Filled 2018-08-25: qty 150

## 2018-08-25 MED ORDER — SODIUM CHLORIDE 0.9 % IV SOLN
500.0000 mg | INTRAVENOUS | Status: DC
  Administered 2018-08-26: 500 mg via INTRAVENOUS
  Filled 2018-08-25 (×2): qty 500

## 2018-08-25 MED ORDER — HYDROCOD POLST-CPM POLST ER 10-8 MG/5ML PO SUER
5.0000 mL | Freq: Two times a day (BID) | ORAL | Status: DC
  Administered 2018-08-25 – 2018-08-27 (×3): 5 mL via ORAL
  Filled 2018-08-25 (×3): qty 5

## 2018-08-25 NOTE — ED Notes (Signed)
Dr. Mayford Knife aware of Troponin of 0.20.

## 2018-08-25 NOTE — ED Notes (Signed)
Pt given a mea tray and ginger ale.

## 2018-08-25 NOTE — Progress Notes (Signed)
CRITICAL VALUE ALERT  Critical Value:  Troponin 0.23  Date & Time Notied:  08/25/2018.6:35 PM   Provider Notified: [email or web address]   Orders Received/Actions taken: no new orders

## 2018-08-25 NOTE — ED Triage Notes (Signed)
Pt states SOB that began last night. Takes a fluid pill. Denies weight gain or swelling. States cough with "flecks of red in it". A&O, ambulatory. Denies SOB with exertion.

## 2018-08-25 NOTE — Progress Notes (Signed)
Patient did not have BC drawn in ED before being given antibiotics.  Paged Dr. Nemiah Commander to see if patient still needed [facility] drawn and she still wants them drawn.  Updated lab.

## 2018-08-25 NOTE — ED Provider Notes (Addendum)
[facility] Emergency Department Provider Note       Time seen: ----------------------------------------- 1:22 PM on 08/25/2018 -----------------------------------------   I have reviewed the triage vital signs and the nursing notes.  HISTORY   Chief Complaint Shortness of Breath    HPI Todd Perez is a 78 y.o. male with a history of diabetes, hyperlipidemia, hypertension, neuromuscular disorder who presents to the ED for breath began last night.  Patient takes a fluid pill currently.  He denies any weight gain or swelling.  He has had some reductive cough.  He denies any shortness of breath with exertion.  Past Medical History:  Diagnosis Date  . Diabetes mellitus without complication (HCC)   . Hyperlipidemia   . Hypertension   . Neuromuscular disorder (HCC)   . Peripheral neuropathy     Patient Active Problem List   Diagnosis Date Noted  . CHF (congestive heart failure) (HCC) 04/18/2018    Past Surgical History:  Procedure Laterality Date  . COLONOSCOPY WITH PROPOFOL N/A 02/06/2015   Procedure: COLONOSCOPY WITH PROPOFOL;  Surgeon: Wallace CullensPaul Y Oh, MD;  [facility];  Service: Gastroenterology;  Laterality: N/A;  . LEFT HEART CATH AND CORONARY ANGIOGRAPHY Left 07/08/2018   Procedure: LEFT HEART CATH AND CORONARY ANGIOGRAPHY;  Surgeon: Dalia HeadingFath, Kenneth A, MD;  Location: [facility] INVASIVE CV LAB;  Service: Cardiovascular;  Laterality: Left;    Allergies Benicar hct [olmesartan medoxomil-hctz] and Lisinopril  Social History Social History   Tobacco Use  . Smoking status: Former Smoker    Packs/day: 0.25    Years: 20.00    Pack years: 5.00    Types: Cigarettes  . Smokeless tobacco: Never Used  Substance Use Topics  . Alcohol use: Not Currently  . Drug use: Not on file    Review of Systems Constitutional: Negative for fever. Cardiovascular: Negative for chest pain. Respiratory: Positive for shortness of breath and  cough Gastrointestinal: Negative for abdominal pain, vomiting and diarrhea. Genitourinary: Negative for dysuria. Musculoskeletal: Negative for back pain. Skin: Negative for rash. Neurological: Negative for headaches, focal weakness or numbness.  All systems negative/normal/unremarkable except as stated in the HPI  ____________________________________________   PHYSICAL EXAM:  VITAL SIGNS: ED Triage Vitals  Enc Vitals Group     BP 08/25/18 1243 112/64     Pulse Rate 08/25/18 1243 (!) 102     Resp 08/25/18 1243 18     Temp 08/25/18 1243 98.1 F (36.7 C)     Temp Source 08/25/18 1243 Oral     SpO2 08/25/18 1243 94 %     Weight 08/25/18 1242 195 lb (88.5 kg)     Height 08/25/18 1242 6\' 1"  (1.854 m)     Head Circumference --      Peak Flow --      Pain Score 08/25/18 1241 0     Pain Loc --      Pain Edu? --      Excl. in GC? --    Constitutional: Alert and oriented. Well appearing and in no distress. Eyes: Conjunctivae are normal. Normal extraocular movements. ENT      Head: Normocephalic and atraumatic.      Nose: No congestion/rhinnorhea.      Mouth/Throat: Mucous membranes are moist.      Neck: No stridor. Cardiovascular: Normal rate, regular rhythm. No murmurs, rubs, or gallops. Respiratory: Normal respiratory effort without tachypnea nor retractions.  Decreased breath sounds on the left Gastrointestinal: Soft and nontender. Normal bowel sounds Musculoskeletal: Nontender  with normal range of motion in extremities. No lower extremity tenderness nor edema. Neurologic:  Normal speech and language. No gross focal neurologic deficits are appreciated.  Skin:  Skin is warm, dry and intact. No rash noted. Psychiatric: Mood and affect are normal. Speech and behavior are normal.  ____________________________________________  EKG: Interpreted by me.  Sinus tachycardia with a rate of 101 bpm, PVCs, left axis deviation, T wave  abnormalities  ____________________________________________  ED COURSE:  As part of my medical decision making, I reviewed the following data within the electronic MEDICAL RECORD NUMBER History obtained from family if available, nursing notes, old chart and ekg, as well as notes from prior ED visits. Patient presented for shortness of breath and cough, we will assess with labs and imaging as indicated at this time.   Procedures ____________________________________________   LABS (pertinent positives/negatives)  Labs Reviewed  BASIC METABOLIC PANEL - Abnormal; Notable for the following components:      Result Value   Sodium 133 (*)    Glucose, Bld 125 (*)    BUN 33 (*)    Creatinine, Ser 1.80 (*)    Calcium 8.7 (*)    GFR calc non Af Amer 35 (*)    GFR calc Af Amer 41 (*)    All other components within normal limits  CBC - Abnormal; Notable for the following components:   RBC 4.05 (*)    Hemoglobin 12.2 (*)    HCT 36.7 (*)    All other components within normal limits  TROPONIN I - Abnormal; Notable for the following components:   Troponin I 0.20 (*)    All other components within normal limits   CRITICAL CARE Performed by: Ulice Dash   Total critical care time: 30 minutes  Critical care time was exclusive of separately billable procedures and treating other patients.  Critical care was necessary to treat or prevent imminent or life-threatening deterioration.  Critical care was time spent personally by me on the following activities: development of treatment plan with patient and/or surrogate as well as nursing, discussions with consultants, evaluation of patient's response to treatment, examination of patient, obtaining history from patient or surrogate, ordering and performing treatments and interventions, ordering and review of laboratory studies, ordering and review of radiographic studies, pulse oximetry and re-evaluation of patient's  condition.  RADIOLOGY Images were viewed by me  Chest x-ray IMPRESSION: Left lower lobe pneumonia. Followup PA and lateral chest X-ray is recommended in 3-4 weeks following trial of antibiotic therapy to ensure resolution and exclude underlying malignancy.  IMPRESSION: Extensive infiltrate LEFT lower lobe, less in remaining lungs consistent with pneumonia.  Associated peribronchial thickening greatest at LEFT lower lobe bronchus which is narrowed.  Enlarged precarinal lymph nodes which may be reactive in the setting of acute infiltrates.  Aneurysmal dilatation ascending thoracic aorta 4.6 cm transverse, recommendation below.  Ascending thoracic aortic aneurysm. Recommend semi-annual imaging followup by CTA or MRA and referral to cardiothoracic surgery if not already obtained. This recommendation follows 2010 ACCF/AHA/AATS/ACR/ASA/SCA/SCAI/SIR/STS/SVM Guidelines for the Diagnosis and Management of Patients With Thoracic Aortic Disease. Circulation. 2010; 121: B017-P102. Aortic aneurysm NOS (ICD10-I71.9)  Aortic Atherosclerosis (ICD10-I70.0).  Aortic aneurysm NOS (ICD10-I71.9). ____________________________________________   DIFFERENTIAL DIAGNOSIS   URI, pneumonia, influenza, CHF, PE, lung cancer  FINAL ASSESSMENT AND PLAN  Dyspnea, community-acquired pneumonia, acute kidney injury, elevated troponin   Plan: The patient had presented for shortness of breath. Patient's labs did reveal elevated creatinine compared to prior, troponin is also elevated compared to  prior. Patient's imaging initially revealed concerns for left lower lobe pneumonia on x-ray.  I have advised him of this, we will start him on IV Levaquin.  Given his abnormal labs and extensive pneumonia advised he need to be admitted.     Ulice Dash, MD    Note: This note was generated in part or whole with voice recognition software. Voice recognition is usually quite accurate but there are  transcription errors that can and very often do occur. I apologize for any typographical errors that were not detected and corrected.     Emily Filbert, MD 08/25/18 1452    Emily Filbert, MD 08/25/18 1505

## 2018-08-25 NOTE — H&P (Signed)
[employer] [facility] [place] [facility]   PATIENT NAME: Todd Perez    MR#:  [id number]  DATE OF BIRTH:  1941/01/14  DATE OF ADMISSION:  08/25/2018  PRIMARY CARE PHYSICIAN: [name], MD   REQUESTING/REFERRING PHYSICIAN: Dr. Daryel November  CHIEF COMPLAINT:   Chief Complaint  Patient presents with  . Shortness of Breath    HISTORY OF PRESENT ILLNESS:  Todd Perez  is a 78 y.o. 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 Symptoms started with myalgias and weakness for the last 3 to 4 days.  Has been having occasional cough with specks of blood in it for the last 3 days.  Last night patient had significant trouble breathing, denies any chest pain.  No fevers but had some chills.  And had few blood clots when he coughed up.  Denies any recent travel.  No exposure to sick contacts.  No nausea or vomiting.  Chest x-ray and CT chest are significant for left lower lobe infiltrate.  Elevated troponin is noted, patient denies any chest pain and prior cardiac history.  PAST MEDICAL HISTORY:   Past Medical History:  Diagnosis Date  . Diabetes mellitus without complication (HCC)   . Hyperlipidemia   . Hypertension   . Neuromuscular disorder (HCC)   . Peripheral neuropathy     PAST SURGICAL HISTORY:   Past Surgical History:  Procedure Laterality Date  . COLONOSCOPY WITH PROPOFOL N/A 02/06/2015   Procedure: COLONOSCOPY WITH PROPOFOL;  Surgeon: Wallace Cullens, MD;  Location: [facility] ENDOSCOPY;  Service: Gastroenterology;  Laterality: N/A;  . LEFT HEART CATH AND CORONARY ANGIOGRAPHY Left 07/08/2018   Procedure: LEFT HEART CATH AND CORONARY ANGIOGRAPHY;  Surgeon: Dalia Heading, MD;  Location: [facility] INVASIVE CV LAB;  Service: Cardiovascular;  Laterality: Left;  . thyroid nodule removal      SOCIAL HISTORY:   Social  History   Tobacco Use  . Smoking status: Former Smoker    Packs/day: 0.25    Years: 20.00    Pack years: 5.00    Types: Cigarettes  . Smokeless tobacco: Never Used  Substance Use Topics  . Alcohol use: Not Currently    FAMILY HISTORY:   Family History  Problem Relation Age of Onset  . Kidney disease Mother   . Emphysema Father   . Lung cancer Father     DRUG ALLERGIES:   Allergies  Allergen Reactions  . Benicar Hct [Olmesartan Medoxomil-Hctz] Itching  . Lisinopril Cough    REVIEW OF SYSTEMS:   Review of Systems  Constitutional: Positive for chills and malaise/fatigue. Negative for fever and weight loss.  HENT: Negative for ear discharge, ear pain, hearing loss and nosebleeds.   Eyes: Negative for blurred vision, double vision and photophobia.  Respiratory: Positive for cough, hemoptysis and shortness of breath. Negative for wheezing.   Cardiovascular: Negative for chest pain, palpitations, orthopnea and leg swelling.  Gastrointestinal: Negative for abdominal pain, constipation, diarrhea, heartburn, melena, nausea and vomiting.  Genitourinary: Negative for dysuria, frequency, hematuria and urgency.  Musculoskeletal: Positive for myalgias. Negative for back pain and neck pain.  Skin: Negative for rash.  Neurological: Negative for dizziness, tingling, sensory change, speech change, focal weakness and headaches.  Endo/Heme/Allergies: Does not bruise/bleed easily.  Psychiatric/Behavioral: Negative for depression.    MEDICATIONS AT HOME:   Prior to Admission medications   Medication Sig  Start Date End Date Taking? Authorizing Provider  allopurinol (ZYLOPRIM) 100 MG tablet Take 100 mg by mouth daily as needed (gout prevention).    Yes [provider]  furosemide (LASIX) 20 MG tablet Take 1 tablet (20 mg total) by mouth 2 (two) times daily. 04/20/18 04/20/19 Yes Gouru, Aruna, MD  hydrochlorothiazide (HYDRODIURIL) 25 MG tablet Take 25 mg by mouth daily. 07/31/18  07/31/19 Yes [provider]  losartan (COZAAR) 100 MG tablet Take 100 mg by mouth daily. 07/31/18 07/31/19 Yes [provider]  levofloxacin (LEVAQUIN) 750 MG tablet Take 1 tablet (750 mg total) by mouth daily. 08/25/18   Emily FilbertWilliams, Jonathan E, MD      VITAL SIGNS:  Blood pressure 100/60, pulse 88, temperature 98.1 F (36.7 C), temperature source Oral, resp. rate 20, height 6\' 1"  (1.854 m), weight 88.5 kg, SpO2 96 %.  PHYSICAL EXAMINATION:   Physical Exam  GENERAL:  78 y.o.-year-old patient lying in the bed with no acute distress.  EYES: Pupils equal, round, reactive to light and accommodation. No scleral icterus. Extraocular muscles intact.  HEENT: Head atraumatic, normocephalic. Oropharynx and nasopharynx clear.  NECK:  Supple, no jugular venous distention. No thyroid enlargement, no tenderness.  LUNGS: Moving air bilaterally anteriorly, decreased at the bases posteriorly and scattered rhonchorous breath sounds worse on the left side. No wheeze,rales or crepitation. No use of accessory muscles of respiration.  CARDIOVASCULAR: S1, S2 normal. No murmurs, rubs, or gallops.  ABDOMEN: Soft, nontender, nondistended. Bowel sounds present. No organomegaly or mass.  EXTREMITIES: No pedal edema, cyanosis, or clubbing.  NEUROLOGIC: Cranial nerves II through XII are intact. Muscle strength 5/5 in all extremities. Sensation intact. Gait not checked.  PSYCHIATRIC: The patient is alert and oriented x 3.  SKIN: No obvious rash, lesion, or ulcer.   LABORATORY PANEL:   CBC Recent Labs  Lab 08/25/18 1246  WBC 9.8  HGB 12.2*  HCT 36.7*  PLT 230   ------------------------------------------------------------------------------------------------------------------  Chemistries  Recent Labs  Lab 08/25/18 1246  NA 133*  K 3.7  CL 98  CO2 24  GLUCOSE 125*  BUN 33*  CREATININE 1.80*  CALCIUM 8.7*    ------------------------------------------------------------------------------------------------------------------  Cardiac Enzymes Recent Labs  Lab 08/25/18 1246  TROPONINI 0.20*   ------------------------------------------------------------------------------------------------------------------  RADIOLOGY:  Dg Chest 2 View  Result Date: 08/25/2018 CLINICAL DATA:  Shortness of breath EXAM: CHEST - 2 VIEW COMPARISON:  04/18/2018 FINDINGS: 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 There is left lower lobe airspace disease most concerning for pneumonia. There is no pleural effusion or pneumothorax. The heart and mediastinal contours are unremarkable. The osseous structures are unremarkable. IMPRESSION: Left lower lobe pneumonia. Followup PA and lateral chest X-ray is recommended in 3-4 weeks following trial of antibiotic therapy to ensure resolution and exclude underlying malignancy. Electronically Signed   By: Elige KoHetal  Patel   On: 08/25/2018 13:16   Ct Chest Wo Contrast  Result Date: 08/25/2018 CLINICAL DATA:  Anterior chest pain, cough, mild shortness of breath since last night, abnormal chest x-ray with LEFT lower lobe pneumonia, diabetes mellitus, hypertension, history CHF EXAM: CT CHEST WITHOUT CONTRAST TECHNIQUE: Multidetector CT imaging of the chest was performed following the standard protocol without IV contrast. Sagittal and coronal MPR images reconstructed from axial data set. COMPARISON:  Chest radiograph 08/25/2018 FINDINGS: Cardiovascular: Mild scattered atherosclerotic calcifications aorta and coronary arteries. Aneurysmal dilatation ascending thoracic aorta measuring 4.6 cm transverse image 87. Minimal pericardial fluid. Mediastinum/Nodes: Enlarged precarinal lymph nodes measuring 16 mm and 12 mm  in short axis images 71-72. No additional thoracic adenopathy. Few scattered normal size RIGHT paratracheal and superior mediastinal lymph nodes noted. Esophagus unremarkable.  Small partially calcified nodule at inferior LEFT thyroid lobe 9 mm diameter. Base of cervical region otherwise normal appearance. Lungs/Pleura: Airspace consolidation throughout LEFT lower lobe consistent with pneumonia. Additional mild patchy infiltrates are seen within the RIGHT middle and lower lobes, minimally in the upper lobes bilaterally LEFT greater than RIGHT. No pleural effusion or pneumothorax. Central peribronchial thickening present. Narrowing of LEFT lower lobe bronchus due to wall thickening. No definite pulmonary mass. Upper Abdomen: Small splenule LEFT upper quadrant. No definite upper abdominal abnormalities. Musculoskeletal: No acute osseous findings. IMPRESSION: Extensive infiltrate LEFT lower lobe, less in remaining lungs consistent with pneumonia. Associated peribronchial thickening greatest at LEFT lower lobe bronchus which is narrowed. Enlarged precarinal lymph nodes which may be reactive in the setting of acute infiltrates. Aneurysmal dilatation ascending thoracic aorta 4.6 cm transverse, recommendation below. Ascending thoracic aortic aneurysm. Recommend semi-annual imaging followup by CTA or MRA and referral to cardiothoracic surgery if not already obtained. This recommendation follows 2010 ACCF/AHA/AATS/ACR/ASA/SCA/SCAI/SIR/STS/SVM Guidelines for the Diagnosis and Management of Patients With Thoracic Aortic Disease. Circulation. 2010; 121: Z610-R604. Aortic aneurysm NOS (ICD10-I71.9) Aortic Atherosclerosis (ICD10-I70.0). Aortic aneurysm NOS (ICD10-I71.9). Electronically Signed   By: Ulyses Southward M.D.   On: 08/25/2018 14:33    EKG:   Orders placed or performed during the hospital encounter of 08/25/18  . EKG 12-Lead  . EKG 12-Lead  . ED EKG within 10 minutes  . ED EKG within 10 minutes    IMPRESSION AND PLAN:   Antron Glaviano  is a 78 y.o. male with a known history of hypertension, diet-controlled diabetes, nonischemic cardiomyopathy, gout presents to hospital secondary to  worsening shortness of breath and cough for 2 days now.  1.  Community-acquired pneumonia-CT chest showing extensive left lower lobe infiltrate. -Also associated with hemoptysis.  Hold aspirin -Follow-up blood cultures.  Started on Rocephin and azithromycin -Supplemental oxygen if needed  2.  Acute renal insufficiency-hold Lasix, losartan and hydrochlorothiazide -Appears prerenal.  Gentle hydration and monitor  3.  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 -Hold aspirin for now.  Patient denies any active cardiac complaints.  4.  Elevated troponin-has chronically elevated troponin, no active chest pain complaints.  Given his hemoptysis, will hold aspirin.  Recycle troponins and monitor -Repeat echocardiogram for any wall motion changes  5.  DVT prophylaxis-teds and SCDs for now -    All the records are reviewed and case discussed with ED provider. Management plans discussed with the patient, family and they are in agreement.  CODE STATUS: Full Code  TOTAL TIME TAKING CARE OF THIS PATIENT: 51 minutes.    Enid Baas M.D on 08/25/2018 at 4:02 PM  Between 7am to 6pm - Pager - [phone]  After 6pm go to [email or web address] - Social research officer, government  [facility]  Office  [phone]  CC: Primary care physician; [name], MD

## 2018-08-25 NOTE — ED Notes (Signed)
ED TO INPATIENT HANDOFF REPORT  Name/Age/Gender Todd Perez 78 y.o. male  Code Status Code Status History    Date Active Date Inactive Code Status Order ID Comments User Context   07/08/2018 0827 07/08/2018 1302 Full Code [id number]  Dalia Heading, MD Inpatient   04/18/2018 1532 04/20/2018 1943 Full Code [id number]  Houston Siren, MD Inpatient      Home/SNF/Other Home  Chief Complaint sob;coughing blood  Level of Care/Admitting Diagnosis ED Disposition    ED Disposition Condition Comment   Admit  Hospital Area: [facility] [[id number]]  Level of Care: Telemetry [5]  Diagnosis: Pneumonia [227785]  Admitting Physician: Enid Baas [[id number]]  Attending Physician: Enid Baas [[id number]]  Estimated length of stay: past midnight tomorrow  Certification:: I certify this patient will need inpatient services for at least 2 midnights  PT Class (Do Not Modify): Inpatient [101]  PT Acc Code (Do Not Modify): Private [1]       Medical History Past Medical History:  Diagnosis Date  . CAD (coronary artery disease)    cath in Nov 2019  . Diabetes mellitus without complication (HCC)   . Hyperlipidemia   . Hypertension   . 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 Neuromuscular disorder (HCC)   . Peripheral neuropathy     Allergies Allergies  Allergen Reactions  . Benicar Hct [Olmesartan Medoxomil-Hctz] Itching  . Lisinopril Cough    IV Location/Drains/Wounds Patient Lines/Drains/Airways Status   Active Line/Drains/Airways    Name:   Placement date:   Placement time:   Site:   Days:   Peripheral IV 08/25/18 Left Forearm   08/25/18    1334    Forearm   less than 1          Labs/Imaging Results for orders placed or performed during the hospital encounter of 08/25/18 (from the past 48 hour(s))  Basic metabolic panel     Status: Abnormal   Collection Time: 08/25/18 12:46 PM  Result Value Ref Range   Sodium 133 (L) 135 - 145 mmol/L    Potassium 3.7 3.5 - 5.1 mmol/L   Chloride 98 98 - 111 mmol/L   CO2 24 22 - 32 mmol/L   Glucose, Bld 125 (H) 70 - 99 mg/dL   BUN 33 (H) 8 - 23 mg/dL   Creatinine, Ser 6.29 (H) 0.61 - 1.24 mg/dL   Calcium 8.7 (L) 8.9 - 10.3 mg/dL   GFR calc non Af Amer 35 (L) >60 mL/min   GFR calc Af Amer 41 (L) >60 mL/min   Anion gap 11 5 - 15    Comment: Performed at [facility], [address] [place], [place] [address]  CBC     Status: Abnormal   Collection Time: 08/25/18 12:46 PM  Result Value Ref Range   WBC 9.8 4.0 - 10.5 K/uL   RBC 4.05 (L) 4.22 - 5.81 MIL/uL   Hemoglobin 12.2 (L) 13.0 - 17.0 g/dL   HCT 65.0 (L) 35.4 - 65.6 %   MCV 90.6 80.0 - 100.0 fL   MCH 30.1 26.0 - 34.0 pg   MCHC 33.2 30.0 - 36.0 g/dL   RDW 81.2 75.1 - 70.0 %   Platelets 230 150 - 400 K/uL   nRBC 0.0 0.0 - 0.2 %    Comment: Performed at [facility], [address] [place], [place] [address]  Troponin I - ONCE - STAT     Status: Abnormal   Collection Time: 08/25/18  12:46 PM  Result Value Ref Range   Troponin I 0.20 (HH) <0.03 ng/mL    Comment: CRITICAL RESULT CALLED TO, READ BACK BY AND VERIFIED WITH SONJA WEAVER AT 1324 ON 08/25/18 BY [facility] Performed at [facility], 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 There is left lower lobe airspace disease most concerning for pneumonia. There is no pleural effusion or pneumothorax. The heart and mediastinal contours are unremarkable. The osseous structures are unremarkable. IMPRESSION: Left lower lobe pneumonia. Followup PA and lateral chest X-ray is recommended in 3-4 weeks following trial of antibiotic therapy to ensure resolution and exclude underlying malignancy. Electronically Signed   By: Elige Ko   On: 08/25/2018 13:16   Ct Chest Wo  Contrast  Result Date: 08/25/2018 CLINICAL DATA:  Anterior chest pain, cough, mild shortness of breath since last night, abnormal chest x-ray with LEFT lower lobe pneumonia, diabetes mellitus, hypertension, history CHF EXAM: CT CHEST WITHOUT CONTRAST TECHNIQUE: Multidetector CT imaging of the chest was performed following the standard protocol without IV contrast. Sagittal and coronal MPR images reconstructed from axial data set. COMPARISON:  Chest radiograph 08/25/2018 FINDINGS: Cardiovascular: Mild scattered atherosclerotic calcifications aorta and coronary arteries. Aneurysmal dilatation ascending thoracic aorta measuring 4.6 cm transverse image 87. Minimal pericardial fluid. Mediastinum/Nodes: Enlarged precarinal lymph nodes measuring 16 mm and 12 mm in short axis images 71-72. No additional thoracic adenopathy. Few scattered normal size RIGHT paratracheal and superior mediastinal lymph nodes noted. Esophagus unremarkable. Small partially calcified nodule at inferior LEFT thyroid lobe 9 mm diameter. Base of cervical region otherwise normal appearance. Lungs/Pleura: Airspace consolidation throughout LEFT lower lobe consistent with pneumonia. Additional mild patchy infiltrates are seen within the RIGHT middle and lower lobes, minimally in the upper lobes bilaterally LEFT greater than RIGHT. No pleural effusion or pneumothorax. Central peribronchial thickening present. Narrowing of LEFT lower lobe bronchus due to wall thickening. No definite pulmonary mass. Upper Abdomen: Small splenule LEFT upper quadrant. No definite upper abdominal abnormalities. Musculoskeletal: No acute osseous findings. IMPRESSION: Extensive infiltrate LEFT lower lobe, less in remaining lungs consistent with pneumonia. Associated peribronchial thickening greatest at LEFT lower lobe bronchus which is narrowed. Enlarged precarinal lymph nodes which may be reactive in the setting of acute infiltrates. Aneurysmal dilatation ascending thoracic  aorta 4.6 cm transverse, recommendation below. Ascending thoracic aortic aneurysm. Recommend semi-annual imaging followup by CTA or MRA and referral to cardiothoracic surgery if not already obtained. This recommendation follows 2010 ACCF/AHA/AATS/ACR/ASA/SCA/SCAI/SIR/STS/SVM Guidelines for the Diagnosis and Management of Patients With Thoracic Aortic Disease. Circulation. 2010; 121: Y659-D357. Aortic aneurysm NOS (ICD10-I71.9) Aortic Atherosclerosis (ICD10-I70.0). Aortic aneurysm NOS (ICD10-I71.9). Electronically Signed   By: Ulyses Southward M.D.   On: 08/25/2018 14:33    Pending Labs [employer] (From admission, onward)    Start     Ordered   Signed and Held  Hemoglobin A1c  Once,   R     Signed and Held   Signed and Held  Troponin I - Now Then Q6H  Now then every 6 hours,   R     Signed and Held   Signed and Held  TSH  Once,   R     Signed and Held   Signed and [employer] metabolic panel  Tomorrow morning,   R  Signed and Held   Signed and Held  CBC  Tomorrow morning,   R     Signed and Held          Vitals/Pain Today's Vitals   08/25/18 1330 08/25/18 1503 08/25/18 1530 08/25/18 1636  BP: 99/64 100/60 101/65 90/65  Pulse: 98 88 91 87  Resp: (!) 21 20 (!) 24 20  Temp:      TempSrc:      SpO2: 95% 96% 100% 100%  Weight:      Height:      PainSc:  0-No pain  0-No pain    Isolation Precautions No active isolations  Medications Medications  levofloxacin (LEVAQUIN) IVPB 750 mg ( Intravenous Stopped 08/25/18 1635)  sodium chloride 0.9 % bolus 500 mL (0 mLs Intravenous Stopped 08/25/18 1713)    Mobility walks

## 2018-08-26 LAB — GLUCOSE, CAPILLARY
Glucose-Capillary: 117 mg/dL — ABNORMAL HIGH (ref 70–99)
Glucose-Capillary: 93 mg/dL (ref 70–99)

## 2018-08-26 LAB — RESPIRATORY PANEL BY PCR
Adenovirus: NOT DETECTED
Bordetella pertussis: NOT DETECTED
CHLAMYDOPHILA PNEUMONIAE-RVPPCR: NOT DETECTED
Coronavirus 229E: NOT DETECTED
Coronavirus HKU1: NOT DETECTED
Coronavirus NL63: NOT DETECTED
Coronavirus OC43: NOT DETECTED
INFLUENZA B-RVPPCR: NOT DETECTED
Influenza A: NOT DETECTED
Metapneumovirus: NOT DETECTED
Mycoplasma pneumoniae: NOT DETECTED
Parainfluenza Virus 1: NOT DETECTED
Parainfluenza Virus 2: NOT DETECTED
Parainfluenza Virus 3: NOT DETECTED
Parainfluenza Virus 4: NOT DETECTED
RESPIRATORY SYNCYTIAL VIRUS-RVPPCR: NOT DETECTED
Rhinovirus / Enterovirus: NOT DETECTED

## 2018-08-26 LAB — CBC
HCT: 35.5 % — ABNORMAL LOW (ref 39.0–52.0)
Hemoglobin: 11.7 g/dL — ABNORMAL LOW (ref 13.0–17.0)
MCH: 30.5 pg (ref 26.0–34.0)
MCHC: 33 g/dL (ref 30.0–36.0)
MCV: 92.4 fL (ref 80.0–100.0)
Platelets: 232 10*3/uL (ref 150–400)
RBC: 3.84 MIL/uL — ABNORMAL LOW (ref 4.22–5.81)
RDW: 13.5 % (ref 11.5–15.5)
WBC: 13.7 10*3/uL — ABNORMAL HIGH (ref 4.0–10.5)
nRBC: 0 % (ref 0.0–0.2)

## 2018-08-26 LAB — BASIC METABOLIC PANEL
Anion gap: 7 (ref 5–15)
BUN: 36 mg/dL — ABNORMAL HIGH (ref 8–23)
CALCIUM: 8.8 mg/dL — AB (ref 8.9–10.3)
CO2: 28 mmol/L (ref 22–32)
Chloride: 99 mmol/L (ref 98–111)
Creatinine, Ser: 1.66 mg/dL — ABNORMAL HIGH (ref 0.61–1.24)
GFR calc Af Amer: 45 mL/min — ABNORMAL LOW (ref >60)
GFR calc non Af Amer: 39 mL/min — ABNORMAL LOW (ref >60)
Glucose, Bld: 104 mg/dL — ABNORMAL HIGH (ref 70–99)
Potassium: 3.9 mmol/L (ref 3.5–5.1)
Sodium: 134 mmol/L — ABNORMAL LOW (ref 135–145)

## 2018-08-26 LAB — INFLUENZA PANEL BY PCR (TYPE A & B)
Influenza A By PCR: NEGATIVE
Influenza B By PCR: NEGATIVE

## 2018-08-26 LAB — HEMOGLOBIN A1C
Hgb A1c MFr Bld: 5.9 % — ABNORMAL HIGH (ref 4.8–5.6)
MEAN PLASMA GLUCOSE: 122.63 mg/dL

## 2018-08-26 LAB — TROPONIN I: Troponin I: 0.22 ng/mL (ref <0.03)

## 2018-08-26 LAB — ECHOCARDIOGRAM COMPLETE
Height: 73 in
Weight: 3228.8 oz

## 2018-08-26 MED ORDER — DIPHENHYDRAMINE HCL 25 MG PO CAPS: 25.0000 mg | ORAL_CAPSULE | Freq: Every evening | ORAL | Status: DC | PRN

## 2018-08-26 MED ORDER — INSULIN ASPART 100 UNIT/ML ~~LOC~~ SOLN: 0.0000 [IU] | Freq: Every day | SUBCUTANEOUS | Status: DC

## 2018-08-26 MED ORDER — INSULIN ASPART 100 UNIT/ML ~~LOC~~ SOLN: 0.0000 [IU] | Freq: Three times a day (TID) | SUBCUTANEOUS | Status: DC

## 2018-08-26 NOTE — Plan of Care (Signed)
No complaints of chest pain. Hemoptysis has been reduced. No respiratory distress noted.

## 2018-08-26 NOTE — Progress Notes (Signed)
Advanced Care Plan.  Purpose of Encounter: CODE STATUS. Parties in Attendance: The patient and me. Patient's Decisional Capacity: Yes. Medical Story: Todd Perez  is a 78 y.o. male with a known history of hypertension, hyperlipidemia, diet-controlled diabetes, peripheral neuropathy ischemic cardiomyopathy with EF 40% and gout.  The patient is admitted for pneumonia and acute renal failure due to dehydration.  I discussed with the patient about his current condition, prognosis and CODE STATUS.  The patient stated that he wants to be resuscitated and intubated if he has cardiopulmonary arrest.  But he does not want to be put on ventilation machine for long time. Plan:  Code Status: Full code. Time spent discussing advance care planning: 18 minutes.

## 2018-08-26 NOTE — Progress Notes (Addendum)
[employer] [facility] [place] at [facility]   PATIENT NAME: Todd Perez    MR#:  [id number]  DATE OF BIRTH:  07-24-1941  SUBJECTIVE:  CHIEF COMPLAINT:   Chief Complaint  Patient presents with  . Shortness of Breath   Patient still has cough and hemoptysis. REVIEW OF SYSTEMS:  Review of Systems  Constitutional: Negative for chills, fever and malaise/fatigue.  HENT: Negative for sore throat.   Eyes: Negative for blurred vision and double vision.  Respiratory: Positive for cough, hemoptysis and sputum production. Negative for shortness of breath, wheezing and stridor.   Cardiovascular: Negative for chest pain, palpitations, orthopnea and leg swelling.  Gastrointestinal: Negative for abdominal pain, blood in stool, diarrhea, melena, nausea and vomiting.  Genitourinary: Negative for dysuria, flank pain and hematuria.  Musculoskeletal: Negative for back pain and joint pain.  Skin: Negative for rash.  Neurological: Negative for dizziness, sensory change, focal weakness, seizures, loss of consciousness, weakness and headaches.  Endo/Heme/Allergies: Negative for polydipsia.  Psychiatric/Behavioral: Negative for depression. The patient is not nervous/anxious.     DRUG ALLERGIES:   Allergies  Allergen Reactions  . Benicar Hct [Olmesartan Medoxomil-Hctz] Itching  . Lisinopril Cough   VITALS:  Blood pressure (!) 105/58, pulse 88, temperature 98.3 F (36.8 C), temperature source Oral, resp. rate 20, height 6\' 1"  (1.854 m), weight 90.9 kg, SpO2 100 %. PHYSICAL EXAMINATION:  Physical Exam Constitutional:      General: He is not in acute distress. HENT:     Head: Normocephalic.     Mouth/Throat:     Mouth: Mucous membranes are moist.  Eyes:     General: No scleral icterus.    Conjunctiva/sclera: Conjunctivae normal.     Pupils: Pupils are equal, round, and reactive to light.  Neck:     Musculoskeletal: Normal range of motion and neck supple.     Vascular: No JVD.      Trachea: No tracheal deviation.  Cardiovascular:     Rate and Rhythm: Normal rate and regular rhythm.     Heart sounds: Normal heart sounds. No murmur. No gallop.   Pulmonary:     Effort: Pulmonary effort is normal. No respiratory distress.     Breath sounds: Normal breath sounds. No stridor. No wheezing, rhonchi or rales.  Chest:     Chest wall: No tenderness.  Abdominal:     General: Bowel sounds are normal. There is no distension.     Palpations: Abdomen is soft.     Tenderness: There is no abdominal tenderness. There is no rebound.  Musculoskeletal: Normal range of motion.        General: No tenderness.     Right lower leg: No edema.     Left lower leg: No edema.  Skin:    Findings: No erythema or rash.  Neurological:     General: No focal deficit present.     Mental Status: He is alert and oriented to person, place, and time.     Cranial Nerves: No cranial nerve deficit.  Psychiatric:        Mood and Affect: Mood normal.    LABORATORY PANEL:  Male CBC Recent Labs  Lab 08/26/18 0533  WBC 13.7*  HGB 11.7*  HCT 35.5*  PLT 232   ------------------------------------------------------------------------------------------------------------------ Chemistries  Recent Labs  Lab 08/26/18 0533  NA 134*  K 3.9  CL 99  CO2 28  GLUCOSE 104*  BUN 36*  CREATININE 1.66*  CALCIUM 8.8*   RADIOLOGY:  No  results found. ASSESSMENT AND PLAN:   Kenric Lindquist  is a 78 y.o. male with a known history of hypertension, diet-controlled diabetes, nonischemic cardiomyopathy, gout presents to hospital secondary to worsening shortness of breath and cough for 2 days now.  1.  Community-acquired pneumonia with leukocytosis -CT chest showing extensive left lower lobe infiltrate. -Also associated with hemoptysis.  Hold aspirin -Follow-up CBC and blood cultures.   Continue Rocephin and azithromycin -Supplemental oxygen if needed  2.  Acute renal insufficiency-hold Lasix, losartan  and hydrochlorothiazide Improving with IV fluid support.  3.  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 -Hold aspirin for now.  Patient denies any active cardiac complaints.  4.  Elevated troponin-has chronically elevated troponin, no active chest pain complaints.  Given his hemoptysis, will hold aspirin.  Recycle troponins and monitor -Repeat echocardiogram: LV EF: 40% -   45%  Diabetes.  Start sliding scale and check hemoglobin A1c. Hyponatremia.  Hold Lasix. Hypertension.  Hold hypertension medication due to renal failure and softer blood pressure.  All the records are reviewed and case discussed with Care Management/Social Worker. Management plans discussed with the patient, family and they are in agreement.  CODE STATUS: Full Code  TOTAL TIME TAKING CARE OF THIS PATIENT: 25 minutes.   More than 50% of the time was spent in counseling/coordination of care: YES  POSSIBLE D/C IN 1-2 DAYS, DEPENDING ON CLINICAL CONDITION.   Shaune Pollack M.D on 08/26/2018 at 2:40 PM  Between 7am to 6pm - Pager - [phone]  After 6pm go to [email or web address] - Therapist, nutritional Hospitalists

## 2018-08-27 LAB — BASIC METABOLIC PANEL
Anion gap: 8 (ref 5–15)
BUN: 29 mg/dL — ABNORMAL HIGH (ref 8–23)
CO2: 25 mmol/L (ref 22–32)
Calcium: 8.5 mg/dL — ABNORMAL LOW (ref 8.9–10.3)
Chloride: 101 mmol/L (ref 98–111)
Creatinine, Ser: 1.39 mg/dL — ABNORMAL HIGH (ref 0.61–1.24)
GFR calc Af Amer: 56 mL/min — ABNORMAL LOW (ref >60)
GFR calc non Af Amer: 48 mL/min — ABNORMAL LOW (ref >60)
Glucose, Bld: 111 mg/dL — ABNORMAL HIGH (ref 70–99)
Potassium: 3.8 mmol/L (ref 3.5–5.1)
Sodium: 134 mmol/L — ABNORMAL LOW (ref 135–145)

## 2018-08-27 LAB — CBC
HCT: 31.6 % — ABNORMAL LOW (ref 39.0–52.0)
Hemoglobin: 10.4 g/dL — ABNORMAL LOW (ref 13.0–17.0)
MCH: 30.2 pg (ref 26.0–34.0)
MCHC: 32.9 g/dL (ref 30.0–36.0)
MCV: 91.9 fL (ref 80.0–100.0)
NRBC: 0 % (ref 0.0–0.2)
Platelets: 191 10*3/uL (ref 150–400)
RBC: 3.44 MIL/uL — AB (ref 4.22–5.81)
RDW: 13.2 % (ref 11.5–15.5)
WBC: 11.2 10*3/uL — ABNORMAL HIGH (ref 4.0–10.5)

## 2018-08-27 LAB — GLUCOSE, CAPILLARY: Glucose-Capillary: 104 mg/dL — ABNORMAL HIGH (ref 70–99)

## 2018-08-27 MED ORDER — FUROSEMIDE 20 MG PO TABS: 20.0000 mg | ORAL_TABLET | Freq: Every day | ORAL | 0 refills | Status: DC | PRN

## 2018-08-27 MED ORDER — LEVOFLOXACIN 750 MG PO TABS: 750.0000 mg | ORAL_TABLET | Freq: Every day | ORAL | 0 refills | Status: DC

## 2018-08-27 MED ORDER — LEVOFLOXACIN 750 MG PO TABS
750.0000 mg | ORAL_TABLET | Freq: Every day | ORAL | Status: DC
  Administered 2018-08-27: 750 mg via ORAL
  Filled 2018-08-27: qty 1

## 2018-08-27 MED ORDER — HYDROCOD POLST-CPM POLST ER 10-8 MG/5ML PO SUER: 5.0000 mL | Freq: Two times a day (BID) | ORAL | 0 refills | Status: DC

## 2018-08-27 NOTE — Progress Notes (Signed)
Discharged to home. He is driving himself.  Prescriptions printed and given to him.  Instructions re Levaquin and follow up appointments

## 2018-08-27 NOTE — Discharge Summary (Signed)
[employer] [facility] - [place] at [facility]   PATIENT NAME: Todd Perez    MR#:  [id number]  DATE OF BIRTH:  [birth year]  DATE OF ADMISSION:  08/25/2018   ADMITTING PHYSICIAN: Enid Baas, MD  DATE OF DISCHARGE:  08/27/18  PRIMARY CARE PHYSICIAN: [name], MD   ADMISSION DIAGNOSIS:   Dyspnea, unspecified type [R06.00] Community acquired pneumonia of left lower lobe of lung (HCC) [J18.1]  DISCHARGE DIAGNOSIS:   Active Problems:   Pneumonia   SECONDARY DIAGNOSIS:   Past Medical History:  Diagnosis Date  . CAD (coronary artery disease)    cath in Nov 2019  . Diabetes mellitus without complication (HCC)   . Hyperlipidemia   . Hypertension   . 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 Neuromuscular disorder (HCC)   . Peripheral neuropathy     HOSPITAL COURSE:   Todd Perez  is a 78 y.o. male with a known history of hypertension, diet-controlled diabetes, nonischemic cardiomyopathy, gout presents to hospital secondary to worsening shortness of breath and cough for 2 days now.  1.  Community-acquired pneumonia-CT chest showing extensive left lower lobe infiltrate. -Also associated with hemoptysis.  Hold aspirin -negative blood cultures.  on Rocephin and azithromycin-significant improvement in symptoms. -No further fevers.  Breathing is much improved and minimal hemoptysis at this time.  Remains on room air. -We will discharge on oral Levaquin  2.  Acute renal insufficiency-improving after holding Lasix, losartan and hydrochlorothiazide -Received IV fluids in the hospital  3.  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 -  Patient denies any active cardiac complaints. -Hold Lasix at discharge.  Advised low-sodium diet and fluid restriction  4.  Elevated troponin-has chronically elevated troponin, no active  chest pain complaints.  Given his hemoptysis,  hold aspirin.  -Stable troponins. -Repeat echocardiogram with no new changes.  Has inferior myocardial hypokinesis.  Patient is up and ambulating well.  Will be discharged home today  DISCHARGE CONDITIONS:   Guarded  CONSULTS OBTAINED:   None  DRUG ALLERGIES:   Allergies  Allergen Reactions  . Benicar Hct [Olmesartan Medoxomil-Hctz] Itching  . Lisinopril Cough   DISCHARGE MEDICATIONS:   Allergies as of 08/27/2018      Reactions   Benicar Hct [olmesartan Medoxomil-hctz] Itching   Lisinopril Cough      Medication List    STOP taking these medications   furosemide 20 MG tablet Commonly known as:  LASIX   hydrochlorothiazide 25 MG tablet Commonly known as:  HYDRODIURIL   losartan 100 MG tablet Commonly known as:  COZAAR     TAKE these medications   allopurinol 100 MG tablet Commonly known as:  ZYLOPRIM Take 100 mg by mouth daily as needed (gout prevention).   chlorpheniramine-HYDROcodone 10-8 MG/5ML Suer Commonly known as:  TUSSIONEX Take 5 mLs by mouth every 12 (twelve) hours.   levofloxacin 750 MG tablet Commonly known as:  LEVAQUIN Take 1 tablet (750 mg total) by mouth daily.        DISCHARGE INSTRUCTIONS:   1.  PCP follow-up in 1 to 2 weeks 2.  Pulmonary follow-up in 2 weeks if no improvement in hemoptysis  DIET:   Cardiac diet  ACTIVITY:   Activity as tolerated  OXYGEN:   Home Oxygen: No.  Oxygen Delivery: room air  DISCHARGE LOCATION:   home   If you experience worsening of your admission symptoms,  develop shortness of breath, life threatening emergency, suicidal or homicidal thoughts you must seek medical attention immediately by calling 911 or calling your MD immediately  if symptoms less severe.  You Must read complete instructions/literature along with all the possible adverse reactions/side effects for all the Medicines you take and that have been prescribed to you. Take any new  Medicines after you have completely understood and accpet all the possible adverse reactions/side effects.   Please note  You were cared for by a hospitalist during your hospital stay. If you have any questions about your discharge medications or the care you received while you were in the hospital after you are discharged, you can call the unit and asked to speak with the hospitalist on call if the hospitalist that took care of you is not available. Once you are discharged, your primary care physician will handle any further medical issues. Please note that NO REFILLS for any discharge medications will be authorized once you are discharged, as it is imperative that you return to your primary care physician (or establish a relationship with a primary care physician if you do not have one) for your aftercare needs so that they can reassess your need for medications and monitor your lab values.    On the day of Discharge:  VITAL SIGNS:   Blood pressure 120/78, pulse 100, temperature 98.6 F (37 C), temperature source Oral, resp. rate 19, height 6\' 1"  (1.854 m), weight 90.9 kg, SpO2 100 %.  PHYSICAL EXAMINATION:    GENERAL:  78 y.o.-year-old patient lying in the bed with no acute distress.  EYES: Pupils equal, round, reactive to light and accommodation. No scleral icterus. Extraocular muscles intact.  HEENT: Head atraumatic, normocephalic. Oropharynx and nasopharynx clear.  NECK:  Supple, no jugular venous distention. No thyroid enlargement, no tenderness.  LUNGS: Moving air bilaterally anteriorly, decreased at the bases posteriorly and scattered rhonchi. No wheeze,rales or crepitation. No use of accessory muscles of respiration.  CARDIOVASCULAR: S1, S2 normal. No murmurs, rubs, or gallops.  ABDOMEN: Soft, nontender, nondistended. Bowel sounds present. No organomegaly or mass.  EXTREMITIES: No pedal edema, cyanosis, or clubbing.  NEUROLOGIC: Cranial nerves II through XII are intact. Muscle  strength 5/5 in all extremities. Sensation intact. Gait not checked.  PSYCHIATRIC: The patient is alert and oriented x 3.  SKIN: No obvious rash, lesion, or ulcer.   DATA REVIEW:   CBC Recent Labs  Lab 08/27/18 0410  WBC 11.2*  HGB 10.4*  HCT 31.6*  PLT 191    Chemistries  Recent Labs  Lab 08/27/18 0410  NA 134*  K 3.8  CL 101  CO2 25  GLUCOSE 111*  BUN 29*  CREATININE 1.39*  CALCIUM 8.5*     Microbiology Results  Results for orders placed or performed during the hospital encounter of 08/25/18  CULTURE, BLOOD (ROUTINE X 2) w Reflex to ID Panel     Status: None (Preliminary result)   Collection Time: 08/25/18  6:24 PM  Result Value Ref Range Status   Specimen Description BLOOD LEFT ANTECUBITAL  Final   Special Requests   Final    BOTTLES DRAWN AEROBIC AND ANAEROBIC Blood Culture results may not be optimal due to an excessive volume of blood received in culture bottles   Culture   Final    NO GROWTH 2 DAYS Performed at [facility], [address] [place], [place] [address]    Report Status PENDING  Incomplete  CULTURE, BLOOD (ROUTINE X 2) w  Reflex to ID Panel     Status: None (Preliminary result)   Collection Time: 08/25/18  6:32 PM  Result Value Ref Range Status   Specimen Description BLOOD RIGHT ANTECUBITAL  Final   Special Requests   Final    BOTTLES DRAWN AEROBIC AND ANAEROBIC Blood Culture results may not be optimal due to an excessive volume of blood received in culture bottles   Culture   Final    NO GROWTH 2 DAYS Performed at [facility], [address] [place], [place] [address]    Report Status PENDING  Incomplete  Respiratory Panel by PCR     Status: None   Collection Time: 08/26/18 11:13 AM  Result Value Ref Range Status   Adenovirus NOT DETECTED NOT DETECTED Final   Coronavirus 229E NOT DETECTED NOT DETECTED Final   Coronavirus HKU1 NOT DETECTED NOT DETECTED Final   Coronavirus NL63 NOT DETECTED NOT DETECTED Final    Coronavirus OC43 NOT DETECTED NOT DETECTED Final   Metapneumovirus NOT DETECTED NOT DETECTED Final   Rhinovirus / Enterovirus NOT DETECTED NOT DETECTED Final   Influenza A NOT DETECTED NOT DETECTED Final   Influenza B NOT DETECTED NOT DETECTED Final   Parainfluenza Virus 1 NOT DETECTED NOT DETECTED Final   Parainfluenza Virus 2 NOT DETECTED NOT DETECTED Final   Parainfluenza Virus 3 NOT DETECTED NOT DETECTED Final   Parainfluenza Virus 4 NOT DETECTED NOT DETECTED Final   Respiratory Syncytial Virus NOT DETECTED NOT DETECTED Final   Bordetella pertussis NOT DETECTED NOT DETECTED Final   Chlamydophila pneumoniae NOT DETECTED NOT DETECTED Final   Mycoplasma pneumoniae NOT DETECTED NOT DETECTED Final    Comment: Performed at [facility], [address] [place], [place] [address]    RADIOLOGY:  No results found.   Management plans discussed with the patient, family and they are in agreement.  CODE STATUS:     Code Status Orders  (From admission, onward)         Start     Ordered   08/25/18 1732  Full code  Continuous     08/25/18 1732        Code Status History    Date Active Date Inactive Code Status Order ID Comments User Context   07/08/2018 0827 07/08/2018 1302 Full Code [id number]  Dalia HeadingFath, Kenneth A, MD Inpatient   04/18/2018 1532 04/20/2018 1943 Full Code [id number]  Houston SirenSainani, Vivek J, MD Inpatient      TOTAL TIME TAKING CARE OF THIS PATIENT: 38 minutes.    Enid Baasadhika Cristle Jared M.D on 08/27/2018 at 8:36 AM  Between 7am to 6pm - Pager - [phone]  After 6pm go to [email or web address] - Social research officer, governmentpassword [email or web address]  [facility]  Office  [phone]  CC: Primary care physician; Jerl MinaHedrick, James, MD   Note: This dictation was prepared with Dragon dictation along with smaller phrase technology. Any transcriptional errors that result from this process are unintentional.

## 2018-08-30 LAB — CULTURE, BLOOD (ROUTINE X 2)
Culture: NO GROWTH
Culture: NO GROWTH

## 2018-09-10 ENCOUNTER — Inpatient Hospital Stay: Payer: Medicare Other | Admitting: Pulmonary Disease

## 2019-09-24 ENCOUNTER — Ambulatory Visit: Payer: Medicare Other | Attending: Internal Medicine

## 2019-09-24 DIAGNOSIS — Z20822 Contact with and (suspected) exposure to covid-19: Secondary | ICD-10-CM

## 2019-09-25 LAB — NOVEL CORONAVIRUS, NAA: SARS-CoV-2, NAA: NOT DETECTED

## 2020-07-19 ENCOUNTER — Other Ambulatory Visit: Payer: Self-pay | Admitting: Pulmonary Disease

## 2020-07-19 ENCOUNTER — Other Ambulatory Visit: Payer: Self-pay

## 2020-07-19 ENCOUNTER — Other Ambulatory Visit
Admission: RE | Admit: 2020-07-19 | Discharge: 2020-07-19 | Disposition: A | Discharge status: Home / Self Care | Payer: Medicare Other | Source: Ambulatory Visit | Attending: Pulmonary Disease | Admitting: Pulmonary Disease

## 2020-07-19 DIAGNOSIS — Z01812 Encounter for preprocedural laboratory examination: Secondary | ICD-10-CM | POA: Diagnosis present

## 2020-07-19 DIAGNOSIS — J90 Pleural effusion, not elsewhere classified: Secondary | ICD-10-CM

## 2020-07-19 DIAGNOSIS — Z20822 Contact with and (suspected) exposure to covid-19: Secondary | ICD-10-CM | POA: Insufficient documentation

## 2020-07-19 LAB — RESP PANEL BY RT-PCR (FLU A&B, COVID) ARPGX2
Influenza A by PCR: NEGATIVE
Influenza B by PCR: NEGATIVE
SARS Coronavirus 2 by RT PCR: NEGATIVE

## 2020-07-20 ENCOUNTER — Other Ambulatory Visit: Payer: Self-pay | Admitting: Interventional Radiology

## 2020-07-20 ENCOUNTER — Other Ambulatory Visit: Payer: Self-pay | Admitting: Pulmonary Disease

## 2020-07-20 ENCOUNTER — Ambulatory Visit
Admission: RE | Admit: 2020-07-20 | Discharge: 2020-07-20 | Disposition: A | Discharge status: Home / Self Care | Payer: Medicare Other | Source: Ambulatory Visit | Attending: Interventional Radiology | Admitting: Interventional Radiology

## 2020-07-20 ENCOUNTER — Ambulatory Visit
Admission: RE | Admit: 2020-07-20 | Discharge: 2020-07-20 | Disposition: A | Discharge status: Home / Self Care | Payer: Medicare Other | Source: Ambulatory Visit | Attending: Pulmonary Disease | Admitting: Pulmonary Disease

## 2020-07-20 DIAGNOSIS — J90 Pleural effusion, not elsewhere classified: Secondary | ICD-10-CM | POA: Diagnosis present

## 2020-07-20 DIAGNOSIS — Z9889 Other specified postprocedural states: Secondary | ICD-10-CM

## 2020-07-20 DIAGNOSIS — J9811 Atelectasis: Secondary | ICD-10-CM | POA: Insufficient documentation

## 2020-07-20 LAB — BODY FLUID CELL COUNT WITH DIFFERENTIAL
Eos, Fluid: 0 %
Lymphs, Fluid: 37 %
Monocyte-Macrophage-Serous Fluid: 14 %
Neutrophil Count, Fluid: 49 %
Total Nucleated Cell Count, Fluid: 194 cu mm

## 2020-07-20 LAB — PROTEIN, PLEURAL OR PERITONEAL FLUID: Total protein, fluid: <3 g/dL

## 2020-07-20 LAB — LACTATE DEHYDROGENASE, PLEURAL OR PERITONEAL FLUID: LD, Fluid: 54 U/L — ABNORMAL HIGH (ref 3–23)

## 2020-07-20 LAB — AMYLASE, PLEURAL OR PERITONEAL FLUID: Amylase, Fluid: 10 U/L

## 2020-07-20 LAB — GLUCOSE, PLEURAL OR PERITONEAL FLUID: Glucose, Fluid: 108 mg/dL

## 2020-07-21 ENCOUNTER — Other Ambulatory Visit: Payer: Self-pay | Admitting: Pulmonary Disease

## 2020-07-21 ENCOUNTER — Other Ambulatory Visit
Admission: RE | Admit: 2020-07-21 | Discharge: 2020-07-21 | Disposition: A | Discharge status: Home / Self Care | Payer: Medicare Other | Source: Ambulatory Visit | Attending: Pulmonary Disease | Admitting: Pulmonary Disease

## 2020-07-21 ENCOUNTER — Other Ambulatory Visit: Payer: Self-pay

## 2020-07-21 DIAGNOSIS — Z20822 Contact with and (suspected) exposure to covid-19: Secondary | ICD-10-CM | POA: Insufficient documentation

## 2020-07-21 DIAGNOSIS — J90 Pleural effusion, not elsewhere classified: Secondary | ICD-10-CM

## 2020-07-21 DIAGNOSIS — Z01812 Encounter for preprocedural laboratory examination: Secondary | ICD-10-CM | POA: Insufficient documentation

## 2020-07-21 LAB — PH, BODY FLUID: pH, Body Fluid: 7.6

## 2020-07-21 LAB — CYTOLOGY - NON PAP

## 2020-07-21 LAB — ACID FAST SMEAR (AFB, MYCOBACTERIA): Acid Fast Smear: NEGATIVE

## 2020-07-21 LAB — PROTEIN, BODY FLUID (OTHER): Total Protein, Body Fluid Other: 1.2 g/dL

## 2020-07-22 LAB — SARS CORONAVIRUS 2 (TAT 6-24 HRS): SARS Coronavirus 2: NEGATIVE

## 2020-07-23 LAB — BODY FLUID CULTURE: Culture: NO GROWTH

## 2020-07-24 ENCOUNTER — Ambulatory Visit
Admission: RE | Admit: 2020-07-24 | Discharge: 2020-07-24 | Disposition: A | Discharge status: Home / Self Care | Payer: Medicare Other | Source: Ambulatory Visit | Attending: Radiology | Admitting: Radiology

## 2020-07-24 ENCOUNTER — Other Ambulatory Visit: Payer: Self-pay

## 2020-07-24 ENCOUNTER — Other Ambulatory Visit: Payer: Self-pay | Admitting: Pulmonary Disease

## 2020-07-24 ENCOUNTER — Ambulatory Visit
Admission: RE | Admit: 2020-07-24 | Discharge: 2020-07-24 | Disposition: A | Discharge status: Home / Self Care | Payer: Medicare Other | Source: Ambulatory Visit | Attending: Pulmonary Disease | Admitting: Pulmonary Disease

## 2020-07-24 DIAGNOSIS — I509 Heart failure, unspecified: Secondary | ICD-10-CM | POA: Insufficient documentation

## 2020-07-24 DIAGNOSIS — Z9889 Other specified postprocedural states: Secondary | ICD-10-CM | POA: Diagnosis not present

## 2020-07-24 DIAGNOSIS — J90 Pleural effusion, not elsewhere classified: Secondary | ICD-10-CM | POA: Diagnosis present

## 2020-07-24 DIAGNOSIS — J9811 Atelectasis: Secondary | ICD-10-CM | POA: Diagnosis not present

## 2020-07-24 LAB — BODY FLUID CELL COUNT WITH DIFFERENTIAL
Eos, Fluid: 0 %
Lymphs, Fluid: 52 %
Monocyte-Macrophage-Serous Fluid: 22 %
Neutrophil Count, Fluid: 26 %
Total Nucleated Cell Count, Fluid: 173 cu mm

## 2020-07-24 LAB — AMYLASE, PLEURAL OR PERITONEAL FLUID: Amylase, Fluid: 20 U/L

## 2020-07-24 NOTE — Procedures (Signed)
Ultrasound-guided diagnostic and therapeutic right sided thoracentesis performed yielding 1550 mililiters of straw colored fluid. No immediate complications.   Diagnostic fluid was sent to the lab for further analysis. Follow-up chest x-ray pending. EBL is < 2 ml.

## 2020-07-25 ENCOUNTER — Other Ambulatory Visit: Payer: Medicare Other

## 2020-07-25 LAB — CYTOLOGY - NON PAP

## 2020-07-25 LAB — PROTEIN, PLEURAL OR PERITONEAL FLUID: Total protein, fluid: <3 g/dL

## 2020-07-25 LAB — GLUCOSE, PLEURAL OR PERITONEAL FLUID: Glucose, Fluid: 108 mg/dL

## 2020-07-25 LAB — ACID FAST SMEAR (AFB, MYCOBACTERIA): Acid Fast Smear: NEGATIVE

## 2020-07-25 LAB — LACTATE DEHYDROGENASE, PLEURAL OR PERITONEAL FLUID: LD, Fluid: 40 U/L — ABNORMAL HIGH (ref 3–23)

## 2020-07-25 LAB — PH, BODY FLUID: pH, Body Fluid: 7.5

## 2020-07-26 LAB — PROTEIN, BODY FLUID (OTHER): Total Protein, Body Fluid Other: 1.3 g/dL

## 2020-07-27 LAB — BODY FLUID CULTURE: Culture: NO GROWTH

## 2020-08-15 ENCOUNTER — Other Ambulatory Visit
Admission: RE | Admit: 2020-08-15 | Discharge: 2020-08-15 | Disposition: A | Discharge status: Home / Self Care | Payer: Medicare Other | Source: Ambulatory Visit | Attending: Rehabilitative and Restorative Service Providers" | Admitting: Rehabilitative and Restorative Service Providers"

## 2020-08-15 DIAGNOSIS — R0602 Shortness of breath: Secondary | ICD-10-CM | POA: Insufficient documentation

## 2020-08-15 DIAGNOSIS — I5020 Unspecified systolic (congestive) heart failure: Secondary | ICD-10-CM | POA: Diagnosis present

## 2020-08-15 DIAGNOSIS — I428 Other cardiomyopathies: Secondary | ICD-10-CM | POA: Diagnosis present

## 2020-08-15 LAB — BRAIN NATRIURETIC PEPTIDE: B Natriuretic Peptide: 885.6 pg/mL — ABNORMAL HIGH (ref 0.0–100.0)

## 2020-08-17 ENCOUNTER — Other Ambulatory Visit
Admission: RE | Admit: 2020-08-17 | Discharge: 2020-08-17 | Disposition: A | Discharge status: Home / Self Care | Payer: Medicare Other | Source: Ambulatory Visit | Attending: Pulmonary Disease | Admitting: Pulmonary Disease

## 2020-08-17 ENCOUNTER — Other Ambulatory Visit: Payer: Self-pay

## 2020-08-17 ENCOUNTER — Other Ambulatory Visit: Payer: Self-pay | Admitting: Pulmonary Disease

## 2020-08-17 DIAGNOSIS — Z01812 Encounter for preprocedural laboratory examination: Secondary | ICD-10-CM | POA: Insufficient documentation

## 2020-08-17 DIAGNOSIS — J90 Pleural effusion, not elsewhere classified: Secondary | ICD-10-CM

## 2020-08-17 DIAGNOSIS — Z20822 Contact with and (suspected) exposure to covid-19: Secondary | ICD-10-CM | POA: Diagnosis not present

## 2020-08-17 LAB — FUNGUS CULTURE WITH STAIN

## 2020-08-17 LAB — FUNGAL ORGANISM REFLEX

## 2020-08-17 LAB — FUNGUS CULTURE RESULT

## 2020-08-17 LAB — SARS CORONAVIRUS 2 BY RT PCR (HOSPITAL ORDER, PERFORMED IN ~~LOC~~ HOSPITAL LAB): SARS Coronavirus 2: NEGATIVE

## 2020-08-18 ENCOUNTER — Ambulatory Visit
Admission: RE | Admit: 2020-08-18 | Discharge: 2020-08-18 | Disposition: A | Discharge status: Home / Self Care | Payer: Medicare Other | Source: Ambulatory Visit | Attending: Pulmonary Disease | Admitting: Pulmonary Disease

## 2020-08-18 ENCOUNTER — Other Ambulatory Visit: Payer: Self-pay | Admitting: Student

## 2020-08-18 ENCOUNTER — Ambulatory Visit
Admission: RE | Admit: 2020-08-18 | Discharge: 2020-08-18 | Disposition: A | Discharge status: Home / Self Care | Payer: Medicare Other | Source: Ambulatory Visit | Attending: Student | Admitting: Student

## 2020-08-18 ENCOUNTER — Other Ambulatory Visit: Payer: Self-pay

## 2020-08-18 DIAGNOSIS — J90 Pleural effusion, not elsewhere classified: Secondary | ICD-10-CM | POA: Insufficient documentation

## 2020-08-18 DIAGNOSIS — Z9889 Other specified postprocedural states: Secondary | ICD-10-CM

## 2020-08-18 LAB — LACTATE DEHYDROGENASE, PLEURAL OR PERITONEAL FLUID: LD, Fluid: 47 U/L — ABNORMAL HIGH (ref 3–23)

## 2020-08-18 LAB — BODY FLUID CELL COUNT WITH DIFFERENTIAL
Eos, Fluid: 0 %
Lymphs, Fluid: 72 %
Monocyte-Macrophage-Serous Fluid: 8 %
Neutrophil Count, Fluid: 20 %
Total Nucleated Cell Count, Fluid: 19 cu mm

## 2020-08-18 LAB — GLUCOSE, PLEURAL OR PERITONEAL FLUID: Glucose, Fluid: 113 mg/dL

## 2020-08-18 LAB — AMYLASE, PLEURAL OR PERITONEAL FLUID: Amylase, Fluid: 19 U/L

## 2020-08-18 LAB — PROTEIN, PLEURAL OR PERITONEAL FLUID: Total protein, fluid: <3 g/dL

## 2020-08-18 NOTE — Procedures (Signed)
PROCEDURE SUMMARY:  Successful US guided right thoracentesis. Yielded 1900 ml of clear yellow fluid. Pt tolerated procedure well. No immediate complications.  CXR ordered; no post-procedure pneumothorax identified  EBL < 2 mL  Mickie Kay, NP 08/18/2020 9:59 AM

## 2020-08-19 LAB — PROTEIN, BODY FLUID (OTHER): Total Protein, Body Fluid Other: 1.4 g/dL

## 2020-08-20 LAB — ACID FAST SMEAR (AFB, MYCOBACTERIA): Acid Fast Smear: NEGATIVE

## 2020-08-21 LAB — BODY FLUID CULTURE
Culture: NO GROWTH
Gram Stain: NONE SEEN

## 2020-08-21 LAB — PH, BODY FLUID: pH, Body Fluid: 7.6

## 2020-08-21 LAB — CYTOLOGY - NON PAP

## 2020-08-22 LAB — FUNGUS CULTURE WITH STAIN

## 2020-08-22 LAB — FUNGUS CULTURE RESULT

## 2020-08-22 LAB — FUNGAL ORGANISM REFLEX

## 2020-09-05 ENCOUNTER — Other Ambulatory Visit: Payer: Self-pay | Admitting: Cardiology

## 2020-09-07 LAB — ACID FAST CULTURE WITH REFLEXED SENSITIVITIES (MYCOBACTERIA): Acid Fast Culture: NEGATIVE

## 2020-09-12 ENCOUNTER — Other Ambulatory Visit: Payer: Self-pay | Admitting: Cardiology

## 2020-09-12 LAB — ACID FAST CULTURE WITH REFLEXED SENSITIVITIES (MYCOBACTERIA): Acid Fast Culture: NEGATIVE

## 2020-09-13 ENCOUNTER — Other Ambulatory Visit: Payer: Self-pay | Admitting: Cardiology

## 2020-09-13 DIAGNOSIS — R0602 Shortness of breath: Secondary | ICD-10-CM

## 2020-09-18 LAB — FUNGUS CULTURE WITH STAIN

## 2020-09-18 LAB — FUNGUS CULTURE RESULT

## 2020-09-18 LAB — FUNGAL ORGANISM REFLEX

## 2020-09-19 ENCOUNTER — Other Ambulatory Visit: Payer: Self-pay

## 2020-09-19 ENCOUNTER — Ambulatory Visit
Admission: RE | Admit: 2020-09-19 | Discharge: 2020-09-19 | Disposition: A | Discharge status: Home / Self Care | Payer: Medicare Other | Source: Ambulatory Visit | Attending: Cardiology | Admitting: Cardiology

## 2020-09-19 DIAGNOSIS — R0602 Shortness of breath: Secondary | ICD-10-CM | POA: Diagnosis present

## 2020-09-19 HISTORY — DX: Heart failure, unspecified: I50.9

## 2020-09-19 MED ORDER — IOHEXOL 300 MG/ML  SOLN
75.0000 mL | Freq: Once | INTRAMUSCULAR | Status: AC | PRN
  Administered 2020-09-19: 65 mL via INTRAVENOUS

## 2020-10-03 LAB — ACID FAST CULTURE WITH REFLEXED SENSITIVITIES (MYCOBACTERIA): Acid Fast Culture: NEGATIVE

## 2020-11-07 ENCOUNTER — Other Ambulatory Visit: Payer: Self-pay | Admitting: Pulmonary Disease

## 2020-11-07 ENCOUNTER — Other Ambulatory Visit (HOSPITAL_COMMUNITY): Payer: Self-pay | Admitting: Pulmonary Disease

## 2020-11-07 DIAGNOSIS — R0602 Shortness of breath: Secondary | ICD-10-CM

## 2020-11-20 ENCOUNTER — Ambulatory Visit
Admission: RE | Admit: 2020-11-20 | Discharge: 2020-11-20 | Disposition: A | Discharge status: Home / Self Care | Payer: Medicare Other | Source: Ambulatory Visit | Attending: Pulmonary Disease | Admitting: Pulmonary Disease

## 2020-11-20 ENCOUNTER — Other Ambulatory Visit: Payer: Self-pay

## 2020-11-20 DIAGNOSIS — R0602 Shortness of breath: Secondary | ICD-10-CM | POA: Insufficient documentation

## 2020-11-23 ENCOUNTER — Encounter: Payer: Self-pay | Admitting: Emergency Medicine

## 2020-11-23 ENCOUNTER — Other Ambulatory Visit: Payer: Self-pay

## 2020-11-23 ENCOUNTER — Observation Stay
Admission: EM | Admit: 2020-11-23 | Discharge: 2020-11-25 | Disposition: A | Discharge status: Home / Self Care | Payer: Medicare Other | Attending: Internal Medicine | Admitting: Internal Medicine

## 2020-11-23 ENCOUNTER — Emergency Department: Payer: Medicare Other

## 2020-11-23 ENCOUNTER — Inpatient Hospital Stay
Admit: 2020-11-23 | Discharge: 2020-11-23 | Disposition: A | Discharge status: Home / Self Care | Payer: Medicare Other | Attending: Internal Medicine | Admitting: Internal Medicine

## 2020-11-23 DIAGNOSIS — R0602 Shortness of breath: Secondary | ICD-10-CM | POA: Diagnosis present

## 2020-11-23 DIAGNOSIS — I255 Ischemic cardiomyopathy: Secondary | ICD-10-CM | POA: Diagnosis not present

## 2020-11-23 DIAGNOSIS — R778 Other specified abnormalities of plasma proteins: Secondary | ICD-10-CM | POA: Diagnosis present

## 2020-11-23 DIAGNOSIS — Z79899 Other long term (current) drug therapy: Secondary | ICD-10-CM | POA: Insufficient documentation

## 2020-11-23 DIAGNOSIS — Z87891 Personal history of nicotine dependence: Secondary | ICD-10-CM | POA: Diagnosis not present

## 2020-11-23 DIAGNOSIS — I5023 Acute on chronic systolic (congestive) heart failure: Secondary | ICD-10-CM | POA: Diagnosis not present

## 2020-11-23 DIAGNOSIS — I10 Essential (primary) hypertension: Secondary | ICD-10-CM | POA: Diagnosis present

## 2020-11-23 DIAGNOSIS — Z20822 Contact with and (suspected) exposure to covid-19: Secondary | ICD-10-CM | POA: Diagnosis not present

## 2020-11-23 DIAGNOSIS — I2510 Atherosclerotic heart disease of native coronary artery without angina pectoris: Secondary | ICD-10-CM | POA: Insufficient documentation

## 2020-11-23 DIAGNOSIS — I110 Hypertensive heart disease with heart failure: Secondary | ICD-10-CM | POA: Diagnosis not present

## 2020-11-23 DIAGNOSIS — I509 Heart failure, unspecified: Secondary | ICD-10-CM

## 2020-11-23 DIAGNOSIS — R7989 Other specified abnormal findings of blood chemistry: Secondary | ICD-10-CM | POA: Diagnosis present

## 2020-11-23 LAB — CBC
HCT: 40.4 % (ref 39.0–52.0)
Hemoglobin: 13.7 g/dL (ref 13.0–17.0)
MCH: 30.6 pg (ref 26.0–34.0)
MCHC: 33.9 g/dL (ref 30.0–36.0)
MCV: 90.2 fL (ref 80.0–100.0)
Platelets: 316 10*3/uL (ref 150–400)
RBC: 4.48 MIL/uL (ref 4.22–5.81)
RDW: 14.1 % (ref 11.5–15.5)
WBC: 5 10*3/uL (ref 4.0–10.5)
nRBC: 0 % (ref 0.0–0.2)

## 2020-11-23 LAB — TROPONIN I (HIGH SENSITIVITY)
Troponin I (High Sensitivity): 232 ng/L (ref <18)
Troponin I (High Sensitivity): 196 ng/L (ref <18)
Troponin I (High Sensitivity): 217 ng/L (ref <18)
Troponin I (High Sensitivity): 250 ng/L (ref <18)

## 2020-11-23 LAB — COMPREHENSIVE METABOLIC PANEL
ALT: 21 U/L (ref 0–44)
AST: 30 U/L (ref 15–41)
Albumin: 3.5 g/dL (ref 3.5–5.0)
Alkaline Phosphatase: 111 U/L (ref 38–126)
Anion gap: 10 (ref 5–15)
BUN: 30 mg/dL — ABNORMAL HIGH (ref 8–23)
CO2: 32 mmol/L (ref 22–32)
Calcium: 9.1 mg/dL (ref 8.9–10.3)
Chloride: 94 mmol/L — ABNORMAL LOW (ref 98–111)
Creatinine, Ser: 1.45 mg/dL — ABNORMAL HIGH (ref 0.61–1.24)
GFR, Estimated: 49 mL/min — ABNORMAL LOW (ref >60)
Glucose, Bld: 109 mg/dL — ABNORMAL HIGH (ref 70–99)
Potassium: 4.3 mmol/L (ref 3.5–5.1)
Sodium: 136 mmol/L (ref 135–145)
Total Bilirubin: 0.7 mg/dL (ref 0.3–1.2)
Total Protein: 8.7 g/dL — ABNORMAL HIGH (ref 6.5–8.1)

## 2020-11-23 LAB — ECHOCARDIOGRAM COMPLETE
AR max vel: 2.03 cm2
AV Area VTI: 2.23 cm2
AV Area mean vel: 2.04 cm2
AV Mean grad: 2 mmHg
AV Peak grad: 4.2 mmHg
Ao pk vel: 1.03 m/s
Area-P 1/2: 4.36 cm2
Height: 73 in
MV VTI: 1.6 cm2
P 1/2 time: 874 msec
S' Lateral: 3.1 cm
Weight: 2400 oz

## 2020-11-23 LAB — BLOOD GAS, VENOUS
Acid-Base Excess: 8.5 mmol/L — ABNORMAL HIGH (ref 0.0–2.0)
Bicarbonate: 37 mmol/L — ABNORMAL HIGH (ref 20.0–28.0)
O2 Saturation: 19.4 %
Patient temperature: 37
pCO2, Ven: 67 mmHg — ABNORMAL HIGH (ref 44.0–60.0)
pH, Ven: 7.35 (ref 7.250–7.430)
pO2, Ven: <31 mmHg — CL (ref 32.0–45.0)

## 2020-11-23 LAB — PROTIME-INR
INR: 1.1 (ref 0.8–1.2)
Prothrombin Time: 14.6 seconds (ref 11.4–15.2)

## 2020-11-23 LAB — RESP PANEL BY RT-PCR (FLU A&B, COVID) ARPGX2
Influenza A by PCR: NEGATIVE
Influenza B by PCR: NEGATIVE
SARS Coronavirus 2 by RT PCR: NEGATIVE

## 2020-11-23 LAB — HEPARIN LEVEL (UNFRACTIONATED): Heparin Unfractionated: 0.14 IU/mL — ABNORMAL LOW (ref 0.30–0.70)

## 2020-11-23 LAB — APTT: aPTT: 31 seconds (ref 24–36)

## 2020-11-23 LAB — BRAIN NATRIURETIC PEPTIDE: B Natriuretic Peptide: 782.8 pg/mL — ABNORMAL HIGH (ref 0.0–100.0)

## 2020-11-23 MED ORDER — HEPARIN BOLUS VIA INFUSION
4000.0000 [IU] | Freq: Once | INTRAVENOUS | Status: AC
  Administered 2020-11-23: 4000 [IU] via INTRAVENOUS
  Filled 2020-11-23: qty 4000

## 2020-11-23 MED ORDER — ATORVASTATIN CALCIUM 20 MG PO TABS
40.0000 mg | ORAL_TABLET | Freq: Every day | ORAL | Status: DC
  Administered 2020-11-24 – 2020-11-25 (×2): 40 mg via ORAL
  Filled 2020-11-23 (×3): qty 2

## 2020-11-23 MED ORDER — ALLOPURINOL 100 MG PO TABS
100.0000 mg | ORAL_TABLET | Freq: Every day | ORAL | Status: DC | PRN
  Filled 2020-11-23: qty 1

## 2020-11-23 MED ORDER — SODIUM CHLORIDE 0.9% FLUSH: 3.0000 mL | INTRAVENOUS | Status: DC | PRN

## 2020-11-23 MED ORDER — SODIUM CHLORIDE 0.9% FLUSH
3.0000 mL | Freq: Two times a day (BID) | INTRAVENOUS | Status: DC
  Administered 2020-11-23 – 2020-11-25 (×5): 3 mL via INTRAVENOUS

## 2020-11-23 MED ORDER — FUROSEMIDE 10 MG/ML IJ SOLN
40.0000 mg | Freq: Every day | INTRAMUSCULAR | Status: DC
  Administered 2020-11-24: 40 mg via INTRAVENOUS
  Filled 2020-11-23: qty 4

## 2020-11-23 MED ORDER — ASPIRIN 81 MG PO CHEW
324.0000 mg | CHEWABLE_TABLET | Freq: Once | ORAL | Status: AC
  Administered 2020-11-23: 324 mg via ORAL
  Filled 2020-11-23: qty 4

## 2020-11-23 MED ORDER — ASPIRIN 81 MG PO CHEW
81.0000 mg | CHEWABLE_TABLET | Freq: Every day | ORAL | Status: DC
  Administered 2020-11-24 – 2020-11-25 (×2): 81 mg via ORAL
  Filled 2020-11-23 (×2): qty 1

## 2020-11-23 MED ORDER — HEPARIN (PORCINE) 25000 UT/250ML-% IV SOLN
800.0000 [IU]/h | INTRAVENOUS | Status: DC
  Administered 2020-11-23: 800 [IU]/h via INTRAVENOUS
  Filled 2020-11-23: qty 250

## 2020-11-23 MED ORDER — FUROSEMIDE 10 MG/ML IJ SOLN
40.0000 mg | Freq: Once | INTRAMUSCULAR | Status: AC
  Administered 2020-11-23: 40 mg via INTRAVENOUS
  Filled 2020-11-23: qty 4

## 2020-11-23 MED ORDER — ENOXAPARIN SODIUM 40 MG/0.4ML ~~LOC~~ SOLN
40.0000 mg | SUBCUTANEOUS | Status: DC
  Administered 2020-11-24: 40 mg via SUBCUTANEOUS
  Filled 2020-11-23: qty 0.4

## 2020-11-23 MED ORDER — ONDANSETRON HCL 4 MG/2ML IJ SOLN: 4.0000 mg | Freq: Four times a day (QID) | INTRAMUSCULAR | Status: DC | PRN

## 2020-11-23 MED ORDER — METOPROLOL SUCCINATE ER 25 MG PO TB24
25.0000 mg | ORAL_TABLET | Freq: Every day | ORAL | Status: DC
  Filled 2020-11-23: qty 1

## 2020-11-23 MED ORDER — SODIUM CHLORIDE 0.9 % IV SOLN: 250.0000 mL | INTRAVENOUS | Status: DC | PRN

## 2020-11-23 MED ORDER — ACETAMINOPHEN 325 MG PO TABS
650.0000 mg | ORAL_TABLET | ORAL | Status: DC | PRN
  Administered 2020-11-24 – 2020-11-25 (×3): 650 mg via ORAL
  Filled 2020-11-23 (×3): qty 2

## 2020-11-23 NOTE — Consult Note (Signed)
[facility] Cardiology Consultation Note  Patient ID: Todd Perez, MRN: [id number], DOB/AGE: [birth year] 80 y.o. Admit date: 11/23/2020   Date of Consult: 11/23/2020 Primary Physician: [name], MD Primary Cardiologist: Lady Gary  Chief Complaint:  Chief Complaint  Patient presents with  . Shortness of Breath   Reason for Consult: Heart failure  HPI: 80 y.o. male with known coronary artery atherosclerosis hypertension hyperlipidemia and chronic systolic dysfunction congestive heart failure who has been on appropriate medication management but continues to have recurrent evidence of acute on chronic systolic dysfunction heart failure.  The patient has had some progression of shortness of breath lower extremity edema PND and orthopnea over the last week.  This is culminated in being seen in the emergency room with an EKG showing normal sinus rhythm with left axis deviation and right bundle branch block with poor R wave progression.  Troponin level was 232/196.  BNP was 784.  Glomerular filtration rate was 49.  Chest x-ray shows pleural effusions and some pulmonary edema.  Echo shows that the patient had global LV systolic dysfunction with significant left ventricular hypertrophy combined systolic dysfunction with diastolic dysfunction with moderate mitral regurgitation.  The patient has had a significant improvements already with intravenous Lasix.  He has not required oxygen at this time.  Currently he is hemodynamically stable  Past Medical History:  Diagnosis Date  . CAD (coronary artery disease)    cath in Nov 2019  . CHF (congestive heart failure) (HCC)   . Hyperlipidemia   . Hypertension   . 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 Neuromuscular disorder (HCC)   . Peripheral neuropathy       Surgical History:  Past Surgical History:  Procedure Laterality Date  . COLONOSCOPY WITH PROPOFOL N/A 02/06/2015   Procedure: COLONOSCOPY WITH PROPOFOL;  Surgeon: Wallace Cullens, MD;  Location:  [facility] ENDOSCOPY;  Service: Gastroenterology;  Laterality: N/A;  . LEFT HEART CATH AND CORONARY ANGIOGRAPHY Left 07/08/2018   Procedure: LEFT HEART CATH AND CORONARY ANGIOGRAPHY;  Surgeon: Dalia Heading, MD;  Location: [facility] INVASIVE CV LAB;  Service: Cardiovascular;  Laterality: Left;  . thyroid nodule removal       Home Meds: Prior to Admission medications   Medication Sig Start Date End Date Taking? Authorizing Provider  allopurinol (ZYLOPRIM) 100 MG tablet Take 100 mg by mouth daily as needed (gout prevention).    Yes [provider]  furosemide (LASIX) 20 MG tablet Take 1 tablet (20 mg total) by mouth daily as needed for fluid or edema (dyspnea). 08/27/18 08/27/19 Yes Enid Baas, MD  metoprolol succinate (TOPROL-XL) 25 MG 24 hr tablet Take 25 mg by mouth daily. 11/01/20  Yes [provider]  TRELEGY ELLIPTA 100-62.5-25 MCG/INH AEPB Inhale 1 puff into the lungs daily. 10/10/20  Yes [provider]  chlorpheniramine-HYDROcodone (TUSSIONEX) 10-8 MG/5ML SUER Take 5 mLs by mouth every 12 (twelve) hours. Patient not taking: No sig reported 08/27/18   Enid Baas, MD  levofloxacin (LEVAQUIN) 750 MG tablet Take 1 tablet (750 mg total) by mouth daily. Patient not taking: No sig reported 08/27/18   Enid Baas, MD  naproxen (NAPROSYN) 250 MG tablet Take 250 mg by mouth 2 (two) times daily with a meal.    [provider]    Inpatient Medications:  . [START ON 11/24/2020] aspirin  81 mg Oral Daily  . atorvastatin  40 mg Oral Daily  . [START ON 11/24/2020] furosemide  40 mg Intravenous Daily  .  metoprolol succinate  25 mg Oral Daily  . sodium chloride flush  3 mL Intravenous Q12H   . sodium chloride    . heparin 800 Units/hr (11/23/20 1012)    Allergies:  Allergies  Allergen Reactions  . Benicar Hct [Olmesartan Medoxomil-Hctz] Itching  . Lisinopril Cough    Social History   Socioeconomic History  . Marital status: Married    Spouse  name: Not on file  . Number of children: Not on file  . Years of education: Not on file  . Highest education level: Not on file  Occupational History  . Not on file  Tobacco Use  . Smoking status: Former Smoker    Packs/day: 0.25    Years: 20.00    Pack years: 5.00    Types: Cigarettes  . Smokeless tobacco: Never Used  Vaping Use  . Vaping Use: Never used  Substance and Sexual Activity  . Alcohol use: Not Currently  . Drug use: Not on file  . Sexual activity: Not on file  Other Topics Concern  . Not on file  Social History Narrative   Independent at baseline.  Lives at home with his wife.  Works as a Production designer, theatre/television/film person in an apartment complex in [employer]   Social Determinants of Corporate investment banker Strain: Not on [employer] Insecurity: Not on file  Transportation Needs: Not on file  Physical Activity: Not on file  Stress: Not on file  Social Connections: Not on file  Intimate Partner Violence: Not on file     Family History  Problem Relation Age of Onset  . Kidney disease Mother   . Emphysema Father   . Lung cancer Father      Review of Systems Positive for shortness of breath PND orthopnea Negative for: General:  chills, fever, night sweats or weight changes.  Cardiovascular: Positive for PND orthopnea negative for syncope dizziness  Dermatological skin lesions rashes Respiratory: Cough congestion Urologic: Frequent urination urination at night and hematuria Abdominal: negative for nausea, vomiting, diarrhea, bright red blood per rectum, melena, or hematemesis Neurologic: negative for visual changes, and/or hearing changes  All other systems reviewed and are otherwise negative except as noted above.  Labs: No results for input(s): CKTOTAL, CKMB, TROPONINI in the last 72 hours. Lab Results  Component Value Date   WBC 5.0 11/23/2020   HGB 13.7 11/23/2020   HCT 40.4 11/23/2020   MCV 90.2 11/23/2020   PLT 316 11/23/2020    Recent Labs  Lab  11/23/20 0808  NA 136  K 4.3  CL 94*  CO2 32  BUN 30*  CREATININE 1.45*  CALCIUM 9.1  PROT 8.7*  BILITOT 0.7  ALKPHOS 111  ALT 21  AST 30  GLUCOSE 109*   No results found for: CHOL, HDL, LDLCALC, TRIG No results found for: DDIMER  Radiology/Studies:  CT CHEST WO CONTRAST  Result Date: 11/22/2020 CLINICAL DATA:  Worsening shortness of breath, weight loss and recurrent pleural effusions. Status post prior bilateral thoracentesis procedures. EXAM: CT CHEST WITHOUT CONTRAST TECHNIQUE: Multidetector CT imaging of the chest was performed following the standard protocol without IV contrast. COMPARISON:  CT of the chest with contrast on 09/19/2020 FINDINGS: Cardiovascular: Moderate cardiac enlargement again noted. The ascending thoracic aorta shows mild dilatation measuring up to 4.2 cm in maximum diameter. No pericardial fluid identified. Stable calcified plaque in the distribution of the LAD and left circumflex coronary arteries. Stable focal calcification at the level of the aortic valve and mitral  annular calcification. Mediastinum/Nodes: Stable mildly prominent precarinal lymph node measuring up to 12 mm in short axis. Other smaller nonenlarged scattered mediastinal lymph nodes present. Lungs/Pleura: Moderate right pleural effusion and small left pleural effusion. Lungs demonstrate some increase in generalized mild alveolar ground-glass opacities mostly in the upper lung zones. Findings support probable underlying heart failure. There remains stable atelectasis and scarring in both lower lobes. No pneumothorax or obvious pulmonary or pleural masses. Upper Abdomen: No acute abnormality. Musculoskeletal: No chest wall mass or suspicious bone lesions identified. IMPRESSION: 1. Moderate right pleural effusion and small left pleural effusion. 2. Slight increase in generalized mild alveolar ground-glass opacities mostly in the upper lung zones. Findings support probable underlying heart failure. 3.  Stable mildly prominent precarinal lymph node measuring up to 12 mm in short axis. 4. Coronary atherosclerosis with calcified plaque in the distribution of the LAD and left circumflex coronary arteries. 5. 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 Recommend annual imaging followup by CTA or MRA. This recommendation follows 2010 ACCF/AHA/AATS/ACR/ASA/SCA/SCAI/SIR/STS/SVM Guidelines for the Diagnosis and Management of Patients with Thoracic Aortic Disease. Circulation. 2010; 121: Z610-R604: E266-e369. 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 Electronically Signed   By: Irish LackGlenn  Yamagata M.D.   On: 11/22/2020 09:31   DG Chest Portable 1 View  Result Date: 11/23/2020 CLINICAL DATA:  Shortness of breath. EXAM: PORTABLE CHEST 1 VIEW COMPARISON:  August 18, 2020. FINDINGS: The heart size and mediastinal contours are within normal limits. No pneumothorax is noted. Increased bilateral pleural effusions are noted with increasing bibasilar edema or atelectasis. The visualized skeletal structures are unremarkable. IMPRESSION: Increased bilateral pleural effusions with associated increased bibasilar atelectasis or edema. Electronically Signed   By: Lupita RaiderJames  Green Jr M.D.   On: 11/23/2020 08:26   ECHOCARDIOGRAM COMPLETE  Result Date: 11/23/2020    ECHOCARDIOGRAM REPORT   Patient Name:   Todd Perez Date of Exam: 11/23/2020 Medical Rec #:  [id number]      Height:       73.0 in Accession #:    [id number]     Weight:       150.0 lb Date of Birth:  Jul 12, 1941     BSA:          1.904 m Patient Age:    79 years       BP:           117/85 mmHg Patient Gender: M              HR:           75 bpm. Exam Location:  [facility] Procedure: 2D Echo, Color Doppler and Cardiac Doppler Indications:     I50.21 CHF-Acute Systolic  History:         Patient has prior history of Echocardiogram examinations.                  Ischemic Cardiomyopathy and CHF, CAD; Risk  Factors:Hypertension                  and Dyslipidemia.  Sonographer:     Humphrey RollsJoan Heiss RDCS (AE) Referring Phys:  [id number] Lucile ShuttersCHUKWU AGBATA Diagnosing Phys: Arnoldo HookerBruce Akera Snowberger MD IMPRESSIONS  1. Left ventricular ejection fraction, by estimation, is 25 to 30%. The left ventricle has severely decreased function. The left ventricle demonstrates global hypokinesis. The left ventricular internal cavity size was mildly dilated. There is severe left ventricular hypertrophy. Left ventricular diastolic parameters are consistent with  Grade II diastolic dysfunction (pseudonormalization).  2. Right ventricular systolic function is normal. The right ventricular size is normal.  3. Left atrial size was moderately dilated.  4. Right atrial size was moderately dilated.  5. The mitral valve is normal in structure. Moderate mitral valve regurgitation.  6. Tricuspid valve regurgitation is moderate.  7. The aortic valve is normal in structure. Aortic valve regurgitation is mild. FINDINGS  Left Ventricle: Left ventricular ejection fraction, by estimation, is 25 to 30%. The left ventricle has severely decreased function. The left ventricle demonstrates global hypokinesis. The left ventricular internal cavity size was mildly dilated. There is severe left ventricular hypertrophy. Left ventricular diastolic parameters are consistent with Grade II diastolic dysfunction (pseudonormalization). Right Ventricle: The right ventricular size is normal. No increase in right ventricular wall thickness. Right ventricular systolic function is normal. Left Atrium: Left atrial size was moderately dilated. Right Atrium: Right atrial size was moderately dilated. Pericardium: There is no evidence of pericardial effusion. Mitral Valve: The mitral valve is normal in structure. Moderate mitral valve regurgitation. MV peak gradient, 4.5 mmHg. The mean mitral valve gradient is 2.0 mmHg. Tricuspid Valve: The tricuspid valve is normal in structure. Tricuspid valve  regurgitation is moderate. Aortic Valve: The aortic valve is normal in structure. Aortic valve regurgitation is mild. Aortic regurgitation PHT measures 874 msec. Aortic valve mean gradient measures 2.0 mmHg. Aortic valve peak gradient measures 4.2 mmHg. Aortic valve area, by VTI measures 2.23 cm. Pulmonic Valve: The pulmonic valve was normal in structure. Pulmonic valve regurgitation is trivial. Aorta: The aortic root and ascending aorta are structurally normal, with no evidence of dilitation. IAS/Shunts: No atrial level shunt detected by color flow Doppler.  LEFT VENTRICLE PLAX 2D LVIDd:         3.80 cm  Diastology LVIDs:         3.10 cm  LV e' lateral:   4.35 cm/s LV PW:         2.00 cm  LV E/e' lateral: 26.2 LV IVS:        1.40 cm LVOT diam:     2.00 cm LV SV:         39 LV SV Index:   20 LVOT Area:     3.14 cm  RIGHT VENTRICLE RV Basal diam:  3.40 cm LEFT ATRIUM           Index       RIGHT ATRIUM           Index LA diam:      3.70 cm 1.94 cm/m  RA Area:     20.70 cm LA Vol (A4C): 73.4 ml 38.55 ml/m RA Volume:   56.30 ml  29.57 ml/m  AORTIC VALVE                   PULMONIC VALVE AV Area (Vmax):    2.03 cm    PV Vmax:       0.66 m/s AV Area (Vmean):   2.04 cm    PV Vmean:      38.800 cm/s AV Area (VTI):     2.23 cm    PV VTI:        0.073 m AV Vmax:           103.00 cm/s PV Peak grad:  1.7 mmHg AV Vmean:          72.200 cm/s PV Mean grad:  1.0 mmHg AV VTI:  0.175 m AV Peak Grad:      4.2 mmHg AV Mean Grad:      2.0 mmHg LVOT Vmax:         66.60 cm/s LVOT Vmean:        46.900 cm/s LVOT VTI:          0.124 m LVOT/AV VTI ratio: 0.71 AI PHT:            874 msec  AORTA Ao Root diam: 4.00 cm MITRAL VALVE MV Area (PHT): 4.36 cm     SHUNTS MV Area VTI:   1.60 cm     Systemic VTI:  0.12 m MV Peak grad:  4.5 mmHg     Systemic Diam: 2.00 cm MV Mean grad:  2.0 mmHg MV Vmax:       1.06 m/s MV Vmean:      56.5 cm/s MV Decel Time: 174 msec MV E velocity: 114.00 cm/s MV A velocity: 42.65 cm/s MV E/A ratio:   2.67 Arnoldo Hooker MD Electronically signed by Arnoldo Hooker MD Signature Date/Time: 11/23/2020/1:23:23 PM    Final     EKG: Normal sinus rhythm with left axis deviation and right bundle branch block  Weights: Filed Weights   11/23/20 0801  Weight: 68 kg     Physical Exam: Blood pressure 106/82, pulse 74, temperature 98.3 F (36.8 C), resp. rate 19, height 6\' 1"  (1.854 m), weight 68 kg, SpO2 97 %. Body mass index is 19.79 kg/m. General: Well developed, well nourished, in no acute distress. Head eyes ears nose throat: Normocephalic, atraumatic, sclera non-icteric, no xanthomas, nares are without discharge. No apparent thyromegaly and/or mass  Lungs: Normal respiratory effort.  Few wheezes, basilar rales, no rhonchi.  Heart: RRR with normal S1 S2. no murmur gallop, no rub, PMI is normal size and placement, carotid upstroke normal without bruit, jugular venous pressure is normal Abdomen: Soft, non-tender, non-distended with normoactive bowel sounds. No hepatomegaly. No rebound/guarding. No obvious abdominal masses. Abdominal aorta is normal size without bruit Extremities: Trace edema. no cyanosis, no clubbing, no ulcers  Peripheral : 2+ bilateral upper extremity pulses, 2+ bilateral femoral pulses, 2+ bilateral dorsal pedal pulse Neuro: Alert and oriented. No facial asymmetry. No focal deficit. Moves all extremities spontaneously. Musculoskeletal: Normal muscle tone without kyphosis Psych:  Responds to questions appropriately with a normal affect.    Assessment: 80 year old male with known coronary artery atherosclerosis hypertension hyperlipidemia chronic kidney disease with acute on chronic combined systolic diastolic dysfunction congestive heart failure with pulmonary effusions and edema with elevated troponin most consistent with demand ischemia rather than acute coronary syndrome  Plan: 1.  Intravenous Lasix for further treatment options of acute on chronic combined systolic  diastolic dysfunction congestive heart failure 2.  No further cardiac diagnostics necessary at this time without evidence of acute coronary syndrome and echocardiogram showing severe LV systolic dysfunction with moderate mitral insufficiency 3.  Continue metoprolol for cardiomyopathy and will consider other medication management including dapagliflozin, spironolactone, and/or ACE inhibitor and/or Entresto.  This consideration will take time due to the fact that the patient does have relatively hypotension 4.  High intensity cholesterol therapy for coronary artery disease 5.  Continue treatment as per above with further physical activity following for need for other intervention but likely okay for possible discharged home tomorrow  Signed, 96 M.D. [facility] Cardiology 11/23/2020, 1:43 PM

## 2020-11-23 NOTE — ED Notes (Signed)
Informed RN bed assigned [id number]

## 2020-11-23 NOTE — Consult Note (Addendum)
   Heart Failure Nurse Navigator Note  HFrEF 25-30% severe LVH.  2 diastolic dysfunction.  Normal right ventricular systolic function.  Moderate biatrial enlargement.  Moderate mitral regurgitation.  Moderate tricuspid regurgitation.  Ejection fraction previously reported at 40 to 45%.  He presented to the emergency room with worsening shortness of breath.   Comorbidities:  Coronary artery disease Hyperlipidemia Hypertension  Labs:  Sodium 136, potassium 4.3, chloride 94, CO2 32, BUN 30, creatinine 1.45, BNP 782 Troponin I 96    Medications:  Atorvastatin 40 mg daily Metoprolol succinate 25 mg daily  He lists allergy to Benicar and lisinopril.  Assessment:  General-he is awake and alert sitting on the side of the gurney in the emergency room in no acute distress.  HEENT-pulls are equal, no JVD.  Cardiac-heart tones of regular rate and rhythm.  Chest-breath sounds diminished in the base, no wheezes or rhonchi.  Abdomen-soft nontender.  Musculoskeletal-there is no lower extremity edema noted  Psych-is pleasant and appropriate, makes good eye contact.   Neurologic-speech is clear, moves all extremities without difficulty.   Presented from home after he had noticed increasing shortness of breath for about 1 week.  States that he weighs himself daily and had noticed a 2 pound weight gain.  He states that he is followed by [place] [facility] and  With Bluetooth they follow his weights.  He admits to some dietary indiscretion on Tuesday of eating potato chips and pork rind.  For the most part he tries to stick with a low sodium diet.  He lives at home with his wife, he states that he works for [employer] 7 days on and 7 days off, 6-hour days.  Discussed the importance of sticking with the fluid restriction, eight 8 ounce cups within a 24-hour period.  Went over the zone magnet, he was also given a living with heart failure booklet, and information about the heart failure  clinic and an appointment time.  He was not sure with his work schedule of that appointment time would work, told him that we would be glad to accommodate and switch his appointment time if there was conflict.   Tresa Endo RN [facility]

## 2020-11-23 NOTE — H&P (Addendum)
History and Physical    Todd Perez VOZ:[id number] DOB: March 14, 1941 DOA: 11/23/2020  PCP: [name], MD   Patient coming from: Home  I have personally briefly reviewed patient's old medical records in [facility]  Chief Complaint: Shortness of breath  HPI: Todd Perez is a 80 y.o. male with medical history significant for coronary artery disease, ischemic cardiomyopathy with a last known LVEF of 40 to 45%, hypertension, chronic systolic heart failure who presents to the emergency room for evaluation of worsening shortness of breath when compared to his baseline.  Shortness of breath is mostly with exertion and he has two-pillow orthopnea but denies paroxysmal nocturnal dyspnea.  He has no lower extremity swelling. He denies having any fever, no chills, no cough, no lower extremity swelling, no abdominal pain, no palpitations, no diaphoresis, no nausea, no vomiting, no changes in his bowel habits, no urinary symptoms, no headache, no focal deficits or blurred vision. Venous blood gas 7.35/67/31/37/19.4 Sodium 136, potassium 4.3, chloride 94, bicarb 32, glucose 109, BUN 30, creatinine 1.45, calcium 9.1, alkaline phosphatase 111, albumin 3.5, AST 30, ALT 21, total protein 8.7, BNP 782, troponin 232, white count 5.0, hemoglobin 13.7, hematocrit 40.4, MCV 90.7, RDW 14.1, platelet count 316 Respiratory viral panel is negative Chest x-ray reviewed by me shows  increased bilateral pleural effusions with associated increased bibasilar atelectasis or edema. Twelve-lead EKG reviewed by me shows sinus rhythm, left axis deviation and PVCs.   ED Course: Patient is an 80 year old male with a history of coronary artery disease and ischemic cardiomyopathy who presents to the ER for evaluation of worsening shortness of breath.  Chest x-ray shows bilateral pleural effusions consistent with CHF and patient has an elevated BNP level.  His troponin was also elevated and patient was started on  heparin drip cardiology recommendation be admitted to the hospital for further evaluation.    Review of Systems: As per HPI otherwise all other systems reviewed and negative.   Past Medical History:  Diagnosis Date  . CAD (coronary artery disease)    cath in Nov 2019  . CHF (congestive heart failure) (HCC)   . Hyperlipidemia   . Hypertension   . 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 Neuromuscular disorder (HCC)   . Peripheral neuropathy     Past Surgical History:  Procedure Laterality Date  . COLONOSCOPY WITH PROPOFOL N/A 02/06/2015   Procedure: COLONOSCOPY WITH PROPOFOL;  Surgeon: Wallace Cullens, MD;  Location: [facility] ENDOSCOPY;  Service: Gastroenterology;  Laterality: N/A;  . LEFT HEART CATH AND CORONARY ANGIOGRAPHY Left 07/08/2018   Procedure: LEFT HEART CATH AND CORONARY ANGIOGRAPHY;  Surgeon: Dalia Heading, MD;  Location: [facility] INVASIVE CV LAB;  Service: Cardiovascular;  Laterality: Left;  . thyroid nodule removal       reports that he has quit smoking. His smoking use included cigarettes. He has a 5.00 pack-year smoking history. He has never used smokeless tobacco. He reports previous alcohol use. No history on file for drug use.  Allergies  Allergen Reactions  . Benicar Hct [Olmesartan Medoxomil-Hctz] Itching  . Lisinopril Cough    Family History  Problem Relation Age of Onset  . Kidney disease Mother   . Emphysema Father   . Lung cancer Father       Prior to Admission medications   Medication Sig Start Date End Date Taking? Authorizing Provider  allopurinol (ZYLOPRIM) 100 MG tablet Take 100 mg by mouth daily as needed (gout prevention).  [provider]  chlorpheniramine-HYDROcodone (TUSSIONEX) 10-8 MG/5ML SUER Take 5 mLs by mouth every 12 (twelve) hours. 08/27/18   Enid Baas, MD  furosemide (LASIX) 20 MG tablet Take 1 tablet (20 mg total) by mouth daily as needed for fluid or edema (dyspnea). 08/27/18 08/27/19  Enid Baas, MD  levofloxacin  (LEVAQUIN) 750 MG tablet Take 1 tablet (750 mg total) by mouth daily. 08/27/18   Enid Baas, MD    Physical Exam: Vitals:   11/23/20 0801 11/23/20 0900  BP:  115/83  Pulse:  83  Resp:  (!) 21  SpO2:  97%  Weight: 68 kg   Height: 6\' 1"  (1.854 m)      Vitals:   11/23/20 0801 11/23/20 0900  BP:  115/83  Pulse:  83  Resp:  (!) 21  SpO2:  97%  Weight: 68 kg   Height: 6\' 1"  (1.854 m)       Constitutional: Alert and oriented x 3 . Not in any apparent distress HEENT:      Head: Normocephalic and atraumatic.         Eyes: PERLA, EOMI, Conjunctivae are normal. Sclera is non-icteric.       Mouth/Throat: Mucous membranes are moist.       Neck: Supple with no signs of meningismus. Cardiovascular: Regular rate and rhythm. No murmurs, gallops, or rubs. 2+ symmetrical distal pulses are present . No JVD. No LE edema Respiratory: Respiratory effort normal .  Crackles at the bases bilaterally. No wheezes, rhonchi.  Gastrointestinal: Soft, non tender, and non distended with positive bowel sounds.  Genitourinary: No CVA tenderness. Musculoskeletal: Nontender with normal range of motion in all extremities. No cyanosis, or erythema of extremities. Neurologic:  Face is symmetric. Moving all extremities. No gross focal neurologic deficits  Skin: Skin is warm, dry.  No rash or ulcers Psychiatric: Mood and affect are normal   Labs on Admission: I have personally reviewed following labs and imaging studies  CBC: Recent Labs  Lab 11/23/20 0808  WBC 5.0  HGB 13.7  HCT 40.4  MCV 90.2  PLT 316   Basic Metabolic Panel: Recent Labs  Lab 11/23/20 0808  NA 136  K 4.3  CL 94*  CO2 32  GLUCOSE 109*  BUN 30*  CREATININE 1.45*  CALCIUM 9.1   GFR: Estimated Creatinine Clearance: 39.1 mL/min (A) (by C-G formula based on SCr of 1.45 mg/dL (H)). Liver Function Tests: Recent Labs  Lab 11/23/20 0808  AST 30  ALT 21  ALKPHOS 111  BILITOT 0.7  PROT 8.7*  ALBUMIN 3.5   No  results for input(s): LIPASE, AMYLASE in the last 168 hours. No results for input(s): AMMONIA in the last 168 hours. Coagulation Profile: Recent Labs  Lab 11/23/20 0808  INR 1.1   Cardiac Enzymes: No results for input(s): CKTOTAL, CKMB, CKMBINDEX, TROPONINI in the last 168 hours. BNP (last 3 results) No results for input(s): PROBNP in the last 8760 hours. HbA1C: No results for input(s): HGBA1C in the last 72 hours. CBG: No results for input(s): GLUCAP in the last 168 hours. Lipid Profile: No results for input(s): CHOL, HDL, LDLCALC, TRIG, CHOLHDL, LDLDIRECT in the last 72 hours. Thyroid Function Tests: No results for input(s): TSH, T4TOTAL, FREET4, T3FREE, THYROIDAB in the last 72 hours. Anemia Panel: No results for input(s): VITAMINB12, FOLATE, FERRITIN, TIBC, IRON, RETICCTPCT in the last 72 hours. Urine analysis: No results found for: COLORURINE, APPEARANCEUR, LABSPEC, PHURINE, GLUCOSEU, HGBUR, BILIRUBINUR, KETONESUR, PROTEINUR, UROBILINOGEN, NITRITE, LEUKOCYTESUR  Radiological Exams  on Admission: DG Chest Portable 1 View  Result Date: 11/23/2020 CLINICAL DATA:  Shortness of breath. EXAM: PORTABLE CHEST 1 VIEW COMPARISON:  August 18, 2020. FINDINGS: The heart size and mediastinal contours are within normal limits. No pneumothorax is noted. Increased bilateral pleural effusions are noted with increasing bibasilar edema or atelectasis. The visualized skeletal structures are unremarkable. IMPRESSION: Increased bilateral pleural effusions with associated increased bibasilar atelectasis or edema. Electronically Signed   By: Lupita Raider M.D.   On: 11/23/2020 08:26     Assessment/Plan Principal Problem:   Acute on chronic systolic CHF (congestive heart failure) (HCC) Active Problems:   CAD (coronary artery disease)   Ischemic cardiomyopathy   Hypertension   Elevated troponin     Acute on chronic systolic heart failure Patient's last known LVEF from 2020 was 40 to 45% He  presents for evaluation of worsening shortness of breath with exertion as well as orthopnea, BNP is elevated and chest x-ray shows bilateral pleural effusions Place patient on Lasix 40 mg IV daily Start patient on metoprolol Patient is not on an ACE/ARB due to allergy Repeat 2D echocardiogram to assess LVEF Maintain low-sodium diet    Elevated troponin Patient has a history of coronary artery disease and presents for evaluation of worsening shortness of breath. His troponin is elevated and twelve-lead EKG does not show any acute finding We will cycle cardiac enzymes Continue heparin drip initiated in the ER Start patient on low-dose beta-blocker and atorvastatin Follow-up results of 2D echocardiogram to rule out regional wall motion abnormality    Hypertension stage III chronic kidney disease Stable  DVT prophylaxis: Heparin Code Status: full code Family Communication: Greater than 50% of time was spent discussing patient's condition and plan of care with him at the bedside.  All questions and concerns have been addressed.  He verbalizes understanding and agrees with the plan. He lists his daughter Romie Levee as his healthcare power of attorney Disposition Plan: Back to previous home environment Consults called: Cardiology Status: At the time of admission, it appears that the appropriate admission status for this patient is inpatient. This is judged to be reasonable and necessary to provide the required intensity of service  to ensure the patient's safety given the presenting symptoms, physical exam findings, and initial radiographic and laboratory data in the context of the comorbid conditions. Patient requires inpatient status due to high intensity of service high risk for further deterioration and high frequency of surveillance required.   Lucile Shutters MD [facility]     11/23/2020, 11:39 AM

## 2020-11-23 NOTE — Progress Notes (Signed)
ANTICOAGULATION CONSULT NOTE - Initial Consult  Pharmacy Consult for Heparin Drip Indication: chest pain/ACS  Allergies  Allergen Reactions  . Benicar Hct [Olmesartan Medoxomil-Hctz] Itching  . Lisinopril Cough    Patient Measurements: Height: 6\' 1"  (185.4 cm) Weight: 68 kg (150 lb) IBW/kg (Calculated) : 79.9 Heparin Dosing Weight: 68 kg  Vital Signs: BP: 115/83 (04/14 0900) Pulse Rate: 83 (04/14 0900)  Labs: Recent Labs    11/23/20 0808  HGB 13.7  HCT 40.4  PLT 316  CREATININE 1.45*  TROPONINIHS 232*    Estimated Creatinine Clearance: 39.1 mL/min (A) (by C-G formula based on SCr of 1.45 mg/dL (H)).   Medical History: Past Medical History:  Diagnosis Date  . CAD (coronary artery disease)    cath in Nov 2019  . CHF (congestive heart failure) (HCC)   . Hyperlipidemia   . Hypertension   . 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 Neuromuscular disorder (HCC)   . Peripheral neuropathy    Assessment: Patient is a 80yo male admitted for possible worsening HF. Troponin is elevated at 232. Pharmacy consulted for Heparin dosing for ACS/NSTEMI. Reviewed home meds, no anticoagulants noted. Baseline labs acceptable.  Goal of Therapy:  Heparin level 0.3-0.7 units/ml Monitor platelets by anticoagulation protocol: Yes   Plan:  Give 4000 units bolus x 1 Start heparin infusion at 800 units/hr Check anti-Xa level in 8 hours and daily while on heparin Continue to monitor H&H and platelets  80yo, PharmD, BCPS 11/23/2020 10:29 AM

## 2020-11-23 NOTE — ED Notes (Signed)
X RAY at bedside 

## 2020-11-23 NOTE — Progress Notes (Signed)
*  PRELIMINARY RESULTS* Echocardiogram 2D Echocardiogram has been performed.  Joanette Gula Hamza Empson 11/23/2020, 1:10 PM

## 2020-11-23 NOTE — ED Provider Notes (Signed)
[facility] Emergency Department Provider Note    Event Date/Time   First MD Initiated Contact with Patient 11/23/20 [phone]     (approximate)  I have reviewed the triage vital signs and the nursing notes.   HISTORY  Chief Complaint Shortness of Breath    HPI Todd Perez is a 80 y.o. male below listed history presents to the ER for evaluation of shortness of breath and fatigue.  Also endorses worsening orthopnea.  Has not felt himself wheezing.  Is on Lasix.  States he feels like he needs to get "fluid drawn off ".  Denies any chest pain or pressure right now.  Does not wear home oxygen.  Does have extensive cardiac history.  Has not noted any worsening swelling.    Past Medical History:  Diagnosis Date  . CAD (coronary artery disease)    cath in Nov 2019  . CHF (congestive heart failure) (HCC)   . Hyperlipidemia   . Hypertension   . 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 Neuromuscular disorder (HCC)   . Peripheral neuropathy    Family History  Problem Relation Age of Onset  . Kidney disease Mother   . Emphysema Father   . Lung cancer Father    Past Surgical History:  Procedure Laterality Date  . COLONOSCOPY WITH PROPOFOL N/A 02/06/2015   Procedure: COLONOSCOPY WITH PROPOFOL;  Surgeon: Wallace Cullens, MD;  Location: [facility] ENDOSCOPY;  Service: Gastroenterology;  Laterality: N/A;  . LEFT HEART CATH AND CORONARY ANGIOGRAPHY Left 07/08/2018   Procedure: LEFT HEART CATH AND CORONARY ANGIOGRAPHY;  Surgeon: Dalia Heading, MD;  Location: [facility] INVASIVE CV LAB;  Service: Cardiovascular;  Laterality: Left;  . thyroid nodule removal     Patient Active Problem List   Diagnosis Date Noted  . Pneumonia 08/25/2018  . CHF (congestive heart failure) (HCC) 04/18/2018      Prior to Admission medications   Medication Sig Start Date End Date Taking? Authorizing Provider  allopurinol (ZYLOPRIM) 100 MG tablet Take 100 mg by mouth daily as needed (gout prevention).      [provider]  chlorpheniramine-HYDROcodone (TUSSIONEX) 10-8 MG/5ML SUER Take 5 mLs by mouth every 12 (twelve) hours. 08/27/18   Enid Baas, MD  furosemide (LASIX) 20 MG tablet Take 1 tablet (20 mg total) by mouth daily as needed for fluid or edema (dyspnea). 08/27/18 08/27/19  Enid Baas, MD  levofloxacin (LEVAQUIN) 750 MG tablet Take 1 tablet (750 mg total) by mouth daily. 08/27/18   Enid Baas, MD    Allergies Benicar hct [olmesartan medoxomil-hctz] and Lisinopril    Social History Social History   Tobacco Use  . Smoking status: Former Smoker    Packs/day: 0.25    Years: 20.00    Pack years: 5.00    Types: Cigarettes  . Smokeless tobacco: Never Used  Vaping Use  . Vaping Use: Never used  Substance Use Topics  . Alcohol use: Not Currently    Review of Systems Patient denies headaches, rhinorrhea, blurry vision, numbness, shortness of breath, chest pain, edema, cough, abdominal pain, nausea, vomiting, diarrhea, dysuria, fevers, rashes or hallucinations unless otherwise stated above in HPI. ____________________________________________   PHYSICAL EXAM:  VITAL SIGNS: Vitals:   11/23/20 0900  BP: 115/83  Pulse: 83  Resp: (!) 21  SpO2: 97%    Constitutional: Alert and oriented.  Eyes: Conjunctivae are normal.  Head: Atraumatic. Nose: No congestion/rhinnorhea. Mouth/Throat: Mucous membranes are moist.   Neck: No stridor.  Painless ROM.  Cardiovascular: Normal rate, regular rhythm. Grossly normal heart sounds.  Good peripheral circulation. Respiratory: Normal respiratory effort.  No retractions. Lungs CTAB. Gastrointestinal: Soft and nontender. No distention. No abdominal bruits. No CVA tenderness. Genitourinary:  Musculoskeletal: No lower extremity tenderness 1+ BLE edema.  No joint effusions. Neurologic:  Normal speech and language. No gross focal neurologic deficits are appreciated. No facial droop Skin:  Skin is warm, dry and  intact. No rash noted. Psychiatric: Mood and affect are normal. Speech and behavior are normal.  ____________________________________________   LABS (all labs ordered are listed, but only abnormal results are displayed)  Results for orders placed or performed during the hospital encounter of 11/23/20 (from the past 24 hour(s))  CBC     Status: None   Collection Time: 11/23/20  8:08 AM  Result Value Ref Range   WBC 5.0 4.0 - 10.5 K/uL   RBC 4.48 4.22 - 5.81 MIL/uL   Hemoglobin 13.7 13.0 - 17.0 g/dL   HCT 62.9 47.6 - 54.6 %   MCV 90.2 80.0 - 100.0 fL   MCH 30.6 26.0 - 34.0 pg   MCHC 33.9 30.0 - 36.0 g/dL   RDW 50.3 54.6 - 56.8 %   Platelets 316 150 - 400 K/uL   nRBC 0.0 0.0 - 0.2 %  Comprehensive metabolic panel     Status: Abnormal   Collection Time: 11/23/20  8:08 AM  Result Value Ref Range   Sodium 136 135 - 145 mmol/L   Potassium 4.3 3.5 - 5.1 mmol/L   Chloride 94 (L) 98 - 111 mmol/L   CO2 32 22 - 32 mmol/L   Glucose, Bld 109 (H) 70 - 99 mg/dL   BUN 30 (H) 8 - 23 mg/dL   Creatinine, Ser 1.27 (H) 0.61 - 1.24 mg/dL   Calcium 9.1 8.9 - 51.7 mg/dL   Total Protein 8.7 (H) 6.5 - 8.1 g/dL   Albumin 3.5 3.5 - 5.0 g/dL   AST 30 15 - 41 U/L   ALT 21 0 - 44 U/L   Alkaline Phosphatase 111 38 - 126 U/L   Total Bilirubin 0.7 0.3 - 1.2 mg/dL   GFR, Estimated 49 (L) >60 mL/min   Anion gap 10 5 - 15  Troponin I (High Sensitivity)     Status: Abnormal   Collection Time: 11/23/20  8:08 AM  Result Value Ref Range   Troponin I (High Sensitivity) 232 (HH) <18 ng/L  Brain natriuretic peptide     Status: Abnormal   Collection Time: 11/23/20  8:08 AM  Result Value Ref Range   B Natriuretic Peptide 782.8 (H) 0.0 - 100.0 pg/mL  Resp Panel by RT-PCR (Flu A&B, Covid) Nasopharyngeal Swab     Status: None   Collection Time: 11/23/20  8:18 AM   Specimen: Nasopharyngeal Swab; Nasopharyngeal(NP) swabs in vial transport medium  Result Value Ref Range   SARS Coronavirus 2 by RT PCR NEGATIVE  NEGATIVE   Influenza A by PCR NEGATIVE NEGATIVE   Influenza B by PCR NEGATIVE NEGATIVE   ____________________________________________  EKG My review and personal interpretation at Time: 9:16   Indication: sob  Rate: 80  Rhythm: sinus Axis: left Other: nonspecific st and t wave abn concistent with previous ____________________________________________  RADIOLOGY  I personally reviewed all radiographic images ordered to evaluate for the above acute complaints and reviewed radiology reports and findings.  These findings were personally discussed with the patient.  Please see medical record for radiology report.  ____________________________________________  PROCEDURES  Procedure(s) performed:  .Critical Care Performed by: Willy Eddy, MD Authorized by: Willy Eddy, MD   Critical care provider statement:    Critical care time (minutes):  35   Critical care time was exclusive of:  Separately billable procedures and treating other patients   Critical care was necessary to treat or prevent imminent or life-threatening deterioration of the following conditions:  Circulatory failure   Critical care was time spent personally by me on the following activities:  Development of treatment plan with patient or surrogate, discussions with consultants, evaluation of patient's response to treatment, examination of patient, obtaining history from patient or surrogate, ordering and performing treatments and interventions, ordering and review of laboratory studies, ordering and review of radiographic studies, pulse oximetry, re-evaluation of patient's condition and review of old charts      Critical Care performed: yes ____________________________________________   INITIAL IMPRESSION / ASSESSMENT AND PLAN / ED COURSE  Pertinent labs & imaging results that were available during my care of the patient were reviewed by me and considered in my medical decision making (see chart for  details).   DDX: Asthma, copd, CHF, pna, ptx, malignancy, Pe, anemia   Kaison L Ormond is a 80 y.o. who presents to the ED with presentation as described above.  Patient frail appearing but in no acute respiratory distress.  Does have some diminished breath sounds bilaterally but not appreciate any wheezing.  Symptoms worsening despite diuresis.  Certainly concerning for worsening CHF.  Also reporting some intermittent pressure and orthopnea.  Troponin is elevated.  EKG unchanged from previous.  Not a STEMI.  Chest x-ray with worsening edema and pulmonary effusions.  Will give IV Lasix.  Will give aspirin as well as heparin.  No contraindications to heparinization.  Will consult cardiology as well as hospitalist for admission.  Patient agreeable to plan.     The patient was evaluated in Emergency Department today for the symptoms described in the history of present illness. He/she was evaluated in the context of the global COVID-19 pandemic, which necessitated consideration that the patient might be at risk for infection with the SARS-CoV-2 virus that causes COVID-19. Institutional protocols and algorithms that pertain to the evaluation of patients at risk for COVID-19 are in a state of rapid change based on information released by regulatory bodies including the CDC and federal and state organizations. These policies and algorithms were followed during the patient's care in the ED.  As part of my medical decision making, I reviewed the following data within the electronic MEDICAL RECORD NUMBER Nursing notes reviewed and incorporated, Labs reviewed, notes from prior ED visits and [place] Controlled Substance Database   ____________________________________________   FINAL CLINICAL IMPRESSION(S) / ED DIAGNOSES  Final diagnoses:  Acute on chronic congestive heart failure, unspecified heart failure type (HCC)      NEW MEDICATIONS STARTED DURING THIS VISIT:  New Prescriptions   No medications on file      Note:  This document was prepared using Dragon voice recognition software and may include unintentional dictation errors.    Willy Eddy, MD 11/23/20 [phone]

## 2020-11-23 NOTE — ED Notes (Signed)
Pt presents to ED via POV with c/o of increasing SOB over the past few weeks but states it is getting worse and he is having dyspnea at rest and when lying flat. Pt states a HX of CHF, no significant swelling noted to lower extremities but pt states 'I think my ankles look a little bigger than normal, slight pitting edema noted. Pt states also needing to have "to be drained" in the past. Pt denies pain. NAD noted. Family at bedside.

## 2020-11-23 NOTE — ED Triage Notes (Signed)
Pt comes into the ED via POV c/o SHOB x months but has progressively gotten worse.  Pt states he has a h/o fluid having to be drained off.  Pt denies any CP.  PT ambulatory to exam room at this time.  H/o states he does have CHF and has noted some new swelling in the lower leg. Pt currently has even and unlabored respirations.

## 2020-11-23 NOTE — ED Notes (Signed)
RN Beecher Mcardle informed that pt is in room at this time.

## 2020-11-24 DIAGNOSIS — I5023 Acute on chronic systolic (congestive) heart failure: Secondary | ICD-10-CM | POA: Diagnosis not present

## 2020-11-24 LAB — BASIC METABOLIC PANEL
Anion gap: 10 (ref 5–15)
BUN: 31 mg/dL — ABNORMAL HIGH (ref 8–23)
CO2: 29 mmol/L (ref 22–32)
Calcium: 8.7 mg/dL — ABNORMAL LOW (ref 8.9–10.3)
Chloride: 97 mmol/L — ABNORMAL LOW (ref 98–111)
Creatinine, Ser: 1.36 mg/dL — ABNORMAL HIGH (ref 0.61–1.24)
GFR, Estimated: 53 mL/min — ABNORMAL LOW (ref >60)
Glucose, Bld: 97 mg/dL (ref 70–99)
Potassium: 4.4 mmol/L (ref 3.5–5.1)
Sodium: 136 mmol/L (ref 135–145)

## 2020-11-24 LAB — CBC
HCT: 37.6 % — ABNORMAL LOW (ref 39.0–52.0)
Hemoglobin: 12.8 g/dL — ABNORMAL LOW (ref 13.0–17.0)
MCH: 30.2 pg (ref 26.0–34.0)
MCHC: 34 g/dL (ref 30.0–36.0)
MCV: 88.7 fL (ref 80.0–100.0)
Platelets: 278 10*3/uL (ref 150–400)
RBC: 4.24 MIL/uL (ref 4.22–5.81)
RDW: 14.1 % (ref 11.5–15.5)
WBC: 5.3 10*3/uL (ref 4.0–10.5)
nRBC: 0 % (ref 0.0–0.2)

## 2020-11-24 LAB — MAGNESIUM: Magnesium: 2 mg/dL (ref 1.7–2.4)

## 2020-11-24 MED ORDER — METOPROLOL SUCCINATE ER 50 MG PO TB24
50.0000 mg | ORAL_TABLET | Freq: Every day | ORAL | Status: DC
  Administered 2020-11-24 – 2020-11-25 (×2): 50 mg via ORAL
  Filled 2020-11-24: qty 1

## 2020-11-24 MED ORDER — FUROSEMIDE 10 MG/ML IJ SOLN
40.0000 mg | Freq: Two times a day (BID) | INTRAMUSCULAR | Status: DC
  Administered 2020-11-24 – 2020-11-25 (×2): 40 mg via INTRAVENOUS
  Filled 2020-11-24 (×2): qty 4

## 2020-11-24 NOTE — Progress Notes (Signed)
[facility] Cardiology [facility] Encounter Note  Patient: Todd Perez / Admit Date: 11/23/2020 / Date of Encounter: 11/24/2020, 9:22 AM   Subjective: Patient overall feels much better since admission with no current evidence of anginal symptoms.  Patient was lying flat and able to breathe well.  There was no concerns for PND and/or orthopnea at this time.  Patient's glomerular filtration rate is stable despite intravenous diuretic.  Troponin is 232/196/250 more consistent with either demand ischemia and/or congestive heart failure rather than acute coronary syndrome Echocardiogram showing severe LV systolic dysfunction with ejection fraction of 20% to 25% Patient on telemetry had a short run of ventricular tachycardia asymptomatic and nonsustained most consistent with his cardiomyopathy but treated with metoprolol  review of Systems: Positive for: Shortness of breath Negative for: Vision change, hearing change, syncope, dizziness, nausea, vomiting,diarrhea, bloody stool, stomach pain, cough, congestion, diaphoresis, urinary frequency, urinary pain,skin lesions, skin rashes Others previously listed  Objective: Telemetry: Normal sinus rhythm Physical Exam: Blood pressure 120/82, pulse 80, temperature 98 F (36.7 C), temperature source Oral, resp. rate 16, height 6\' 1"  (1.854 m), weight 68.7 kg, SpO2 100 %. Body mass index is 19.98 kg/m. General: Well developed, well nourished, in no acute distress. Head: Normocephalic, atraumatic, sclera non-icteric, no xanthomas, nares are without discharge. Neck: No apparent masses Lungs: Normal respirations with few wheezes, no rhonchi, no rales , few crackles   Heart: Regular rate and rhythm, normal S1 S2, no murmur, no rub, no gallop, PMI is normal size and placement, carotid upstroke normal without bruit, jugular venous pressure normal Abdomen: Soft, non-tender, non-distended with normoactive bowel sounds. No hepatosplenomegaly. Abdominal aorta is  normal size without bruit Extremities: Trace edema, no clubbing, no cyanosis, no ulcers,  Peripheral: 2+ radial, 2+ femoral, 2+ dorsal pedal pulses Neuro: Alert and oriented. Moves all extremities spontaneously. Psych:  Responds to questions appropriately with a normal affect.   Intake/Output Summary (Last 24 hours) at 11/24/2020 11/26/2020 Last data filed at 11/24/2020 0500 Gross per 24 hour  Intake 391.5 ml  Output 300 ml  Net 91.5 ml    Inpatient Medications:  . aspirin  81 mg Oral Daily  . atorvastatin  40 mg Oral Daily  . enoxaparin (LOVENOX) injection  40 mg Subcutaneous Q24H  . furosemide  40 mg Intravenous Daily  . metoprolol succinate  25 mg Oral Daily  . sodium chloride flush  3 mL Intravenous Q12H   Infusions:  . sodium chloride      Labs: Recent Labs    11/23/20 0808 11/24/20 0455  NA 136 136  K 4.3 4.4  CL 94* 97*  CO2 32 29  GLUCOSE 109* 97  BUN 30* 31*  CREATININE 1.45* 1.36*  CALCIUM 9.1 8.7*   Recent Labs    11/23/20 0808  AST 30  ALT 21  ALKPHOS 111  BILITOT 0.7  PROT 8.7*  ALBUMIN 3.5   Recent Labs    11/23/20 0808 11/24/20 0455  WBC 5.0 5.3  HGB 13.7 12.8*  HCT 40.4 37.6*  MCV 90.2 88.7  PLT 316 278   No results for input(s): CKTOTAL, CKMB, TROPONINI in the last 72 hours. Invalid input(s): POCBNP No results for input(s): HGBA1C in the last 72 hours.   Weights: Filed Weights   11/23/20 0801 11/23/20 1815 11/24/20 0500  Weight: 68 kg 67 kg 68.7 kg     Radiology/Studies:  CT CHEST WO CONTRAST  Result Date: 11/22/2020 CLINICAL DATA:  Worsening shortness of breath, weight loss and recurrent  pleural effusions. Status post prior bilateral thoracentesis procedures. EXAM: CT CHEST WITHOUT CONTRAST TECHNIQUE: Multidetector CT imaging of the chest was performed following the standard protocol without IV contrast. COMPARISON:  CT of the chest with contrast on 09/19/2020 FINDINGS: Cardiovascular: Moderate cardiac enlargement again noted. The  ascending thoracic aorta shows mild dilatation measuring up to 4.2 cm in maximum diameter. No pericardial fluid identified. Stable calcified plaque in the distribution of the LAD and left circumflex coronary arteries. Stable focal calcification at the level of the aortic valve and mitral annular calcification. Mediastinum/Nodes: Stable mildly prominent precarinal lymph node measuring up to 12 mm in short axis. Other smaller nonenlarged scattered mediastinal lymph nodes present. Lungs/Pleura: Moderate right pleural effusion and small left pleural effusion. Lungs demonstrate some increase in generalized mild alveolar ground-glass opacities mostly in the upper lung zones. Findings support probable underlying heart failure. There remains stable atelectasis and scarring in both lower lobes. No pneumothorax or obvious pulmonary or pleural masses. Upper Abdomen: No acute abnormality. Musculoskeletal: No chest wall mass or suspicious bone lesions identified. IMPRESSION: 1. Moderate right pleural effusion and small left pleural effusion. 2. Slight increase in generalized mild alveolar ground-glass opacities mostly in the upper lung zones. Findings support probable underlying heart failure. 3. Stable mildly prominent precarinal lymph node measuring up to 12 mm in short axis. 4. Coronary atherosclerosis with calcified plaque in the distribution of the LAD and left circumflex coronary arteries. 5. 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 Recommend annual imaging followup by CTA or MRA. This recommendation follows 2010 ACCF/AHA/AATS/ACR/ASA/SCA/SCAI/SIR/STS/SVM Guidelines for the Diagnosis and Management of Patients with Thoracic Aortic Disease. Circulation. 2010; 121: O709-G283. 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 Electronically Signed   By: Irish Lack M.D.   On: 11/22/2020 09:31   DG Chest Portable 1 View  Result Date:  11/23/2020 CLINICAL DATA:  Shortness of breath. EXAM: PORTABLE CHEST 1 VIEW COMPARISON:  August 18, 2020. FINDINGS: The heart size and mediastinal contours are within normal limits. No pneumothorax is noted. Increased bilateral pleural effusions are noted with increasing bibasilar edema or atelectasis. The visualized skeletal structures are unremarkable. IMPRESSION: Increased bilateral pleural effusions with associated increased bibasilar atelectasis or edema. Electronically Signed   By: Lupita Raider M.D.   On: 11/23/2020 08:26   ECHOCARDIOGRAM COMPLETE  Result Date: 11/23/2020    ECHOCARDIOGRAM REPORT   Patient Name:   CLEBURNE SAVINI Date of Exam: 11/23/2020 Medical Rec #:  [id number]      Height:       73.0 in Accession #:    [id number]     Weight:       150.0 lb Date of Birth:  1941/08/05     BSA:          1.904 m Patient Age:    79 years       BP:           117/85 mmHg Patient Gender: M              HR:           75 bpm. Exam Location:  [facility] Procedure: 2D Echo, Color Doppler and Cardiac Doppler Indications:     I50.21 CHF-Acute Systolic  History:         Patient has prior history of Echocardiogram examinations.                  Ischemic Cardiomyopathy and CHF,  CAD; Risk Factors:Hypertension                  and Dyslipidemia.  Sonographer:     Humphrey Rolls RDCS (AE) Referring Phys:  [id number] Lucile Shutters Diagnosing Phys: Arnoldo Hooker MD IMPRESSIONS  1. Left ventricular ejection fraction, by estimation, is 25 to 30%. The left ventricle has severely decreased function. The left ventricle demonstrates global hypokinesis. The left ventricular internal cavity size was mildly dilated. There is severe left ventricular hypertrophy. Left ventricular diastolic parameters are consistent with Grade II diastolic dysfunction (pseudonormalization).  2. Right ventricular systolic function is normal. The right ventricular size is normal.  3. Left atrial size was moderately dilated.  4. Right atrial size was moderately  dilated.  5. The mitral valve is normal in structure. Moderate mitral valve regurgitation.  6. Tricuspid valve regurgitation is moderate.  7. The aortic valve is normal in structure. Aortic valve regurgitation is mild. FINDINGS  Left Ventricle: Left ventricular ejection fraction, by estimation, is 25 to 30%. The left ventricle has severely decreased function. The left ventricle demonstrates global hypokinesis. The left ventricular internal cavity size was mildly dilated. There is severe left ventricular hypertrophy. Left ventricular diastolic parameters are consistent with Grade II diastolic dysfunction (pseudonormalization). Right Ventricle: The right ventricular size is normal. No increase in right ventricular wall thickness. Right ventricular systolic function is normal. Left Atrium: Left atrial size was moderately dilated. Right Atrium: Right atrial size was moderately dilated. Pericardium: There is no evidence of pericardial effusion. Mitral Valve: The mitral valve is normal in structure. Moderate mitral valve regurgitation. MV peak gradient, 4.5 mmHg. The mean mitral valve gradient is 2.0 mmHg. Tricuspid Valve: The tricuspid valve is normal in structure. Tricuspid valve regurgitation is moderate. Aortic Valve: The aortic valve is normal in structure. Aortic valve regurgitation is mild. Aortic regurgitation PHT measures 874 msec. Aortic valve mean gradient measures 2.0 mmHg. Aortic valve peak gradient measures 4.2 mmHg. Aortic valve area, by VTI measures 2.23 cm. Pulmonic Valve: The pulmonic valve was normal in structure. Pulmonic valve regurgitation is trivial. Aorta: The aortic root and ascending aorta are structurally normal, with no evidence of dilitation. IAS/Shunts: No atrial level shunt detected by color flow Doppler.  LEFT VENTRICLE PLAX 2D LVIDd:         3.80 cm  Diastology LVIDs:         3.10 cm  LV e' lateral:   4.35 cm/s LV PW:         2.00 cm  LV E/e' lateral: 26.2 LV IVS:        1.40 cm LVOT  diam:     2.00 cm LV SV:         39 LV SV Index:   20 LVOT Area:     3.14 cm  RIGHT VENTRICLE RV Basal diam:  3.40 cm LEFT ATRIUM           Index       RIGHT ATRIUM           Index LA diam:      3.70 cm 1.94 cm/m  RA Area:     20.70 cm LA Vol (A4C): 73.4 ml 38.55 ml/m RA Volume:   56.30 ml  29.57 ml/m  AORTIC VALVE                   PULMONIC VALVE AV Area (Vmax):    2.03 cm    PV Vmax:       0.66  m/s AV Area (Vmean):   2.04 cm    PV Vmean:      38.800 cm/s AV Area (VTI):     2.23 cm    PV VTI:        0.073 m AV Vmax:           103.00 cm/s PV Peak grad:  1.7 mmHg AV Vmean:          72.200 cm/s PV Mean grad:  1.0 mmHg AV VTI:            0.175 m AV Peak Grad:      4.2 mmHg AV Mean Grad:      2.0 mmHg LVOT Vmax:         66.60 cm/s LVOT Vmean:        46.900 cm/s LVOT VTI:          0.124 m LVOT/AV VTI ratio: 0.71 AI PHT:            874 msec  AORTA Ao Root diam: 4.00 cm MITRAL VALVE MV Area (PHT): 4.36 cm     SHUNTS MV Area VTI:   1.60 cm     Systemic VTI:  0.12 m MV Peak grad:  4.5 mmHg     Systemic Diam: 2.00 cm MV Mean grad:  2.0 mmHg MV Vmax:       1.06 m/s MV Vmean:      56.5 cm/s MV Decel Time: 174 msec MV E velocity: 114.00 cm/s MV A velocity: 42.65 cm/s MV E/A ratio:  2.67 Arnoldo Hooker MD Electronically signed by Arnoldo Hooker MD Signature Date/Time: 11/23/2020/1:23:23 PM    Final      Assessment and Recommendation  80 y.o. 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  Continue intravenous Lasix this a.m. and further consideration when discharged to home of having furosemide 80 mg p.o. daily 2.  Continue metoprolol for cardiomyopathy LV systolic dysfunction and nonsustained ventricular tachycardia due to cardiomyopathy and increased dose to 50 mg for discharge to home.  Further medication management  including the possibility of ACE inhibitor Entresto dapagliflozin will be treated as an outpatient due to concerns of hypotension prior to discharge 3.  High intensity cholesterol therapy for coronary artery disease with no current evidence of myocardial infarction 4.  No further cardiac diagnostics necessary at this time due to no evidence of myocardial infarction 5.  If ambulating well this a.m. okay for discharge home from cardiac standpoint with follow-up next week for further adjustments as per above  Signed, Arnoldo Hooker M.D. FACC

## 2020-11-24 NOTE — Progress Notes (Signed)
PT Cancellation Note  Patient Details Name: Todd Perez MRN: [id number] DOB: 1940/12/21   Cancelled Treatment:    Reason Eval/Treat Not Completed: PT screened, no needs identified, will sign off. Patient ambulated 200 feet no AD, no difficulties. PT to sign off at this time.    [name] 11/24/2020, 1:52 PM

## 2020-11-24 NOTE — Progress Notes (Signed)
Mobility Specialist - Progress Note   11/24/20 1100  Mobility  Activity Ambulated in hall  Level of Assistance Independent  Assistive Device None  Distance Ambulated (ft) 200 ft  Mobility Response Tolerated well  Mobility performed by Mobility specialist  $Mobility charge 1 Mobility    Pre-mobility: 88 HR, 98% SpO2 During mobility: 100 HR, 99% SpO2 Post-mobility: 92 HR, 97% SpO2   Pt ambulated in hallway without AD. No LOB. Independent. Voiced mild SOB on RA, O2 maintained high 90s throughout session. PLB educated and engaged. Denies dizziness and weakness. 1 short standing rest break taken during ambulation. Denied fatigue after activity.    Filiberto Pinks Mobility Specialist 11/24/20, 11:25 AM

## 2020-11-24 NOTE — Progress Notes (Addendum)
   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 Alveria Apley  note today he plans to add Delene Loll and Farxiga as an outpatient due to concerns over hypotension at this time.  He also mentioned an ACE inhibitor but patient has listed lisinopril which caused a cough.  Met with patient again today.  Was up ambulating around his room, states that he still gets somewhat short of breath and does not feel that he is back to baseline yet.   By  teach back method discussed daily weights and what to report.  Again he states that [place] [facility] monitors his weights at home.  But reinforced if he gains 2 to 3 pounds overnight or 5 pounds within a week that he needs to be calling his physician.  Also if he notices increasing shortness of breath or edema,etc he needs to call, hoping to avoid rehospitalization.  Discussed low-sodium diet, he states" I guess that means that I cannot eat ham for Easter."  Advised that ham was probably not the wisest choice.  Also discussed fluid restriction of less than eight 8 ounce cups in 24 hours.  States that this is the part that is confusing as  some people tell you to drink as much as you want, now you are wanting me to keep it less than eight 8 ounce cups in 24 hours.  Explained with the function of his heart at about 25% he does not need to be taking in a lot of fluid to make his heart work harder.    Offered patient the video on heart failure to watch but he states that he seen  The ones that [place] [facility] is sent to his house and is not interested at this time.  Pricilla Riffle RN CHFN

## 2020-11-24 NOTE — Care Management CC44 (Signed)
Condition Code 44 Documentation Completed  Patient Details  Name: Todd Perez MRN: [id number] Date of Birth: [birth year]   Condition Code 44 given:  Yes Patient signature on Condition Code 44 notice:  Yes Documentation of 2 MD's agreement:  Yes Code 44 added to claim:  Yes    [name], LCSW 11/24/2020, 1:51 PM

## 2020-11-24 NOTE — Care Management Obs Status (Signed)
MEDICARE OBSERVATION STATUS NOTIFICATION   Patient Details  Name: Todd Perez MRN: [id number] Date of Birth: September 15, 1940   Medicare Observation Status Notification Given:  Yes    Martrice Apt A Koda Routon, LCSW 11/24/2020, 1:51 PM

## 2020-11-24 NOTE — Progress Notes (Signed)
PROGRESS NOTE    Todd Perez  WUJ:[id number] DOB: Dec 11, 1940 DOA: 11/23/2020 PCP: Jerl MinaHedrick, James, MD  Brief Narrative: 80 year old male with history of CAD, ischemic cardiomyopathy, EF of 45% presented to the ED with worsening shortness of breath on 4/14. -In the ED he was noted to have elevated high-sensitivity troponin, elevated BNP and chest x-ray which noted increased bilateral pleural effusions -On telemetry has had multiple PVCs, bigeminy and nonsustained V. tach   Assessment & Plan:   Acute on chronic systolic CHF CAD, ischemic cardiomyopathy LVEF down from 40-45% in 2022 down to 25% now -Clinically improving with diuresis, not back to baseline yet, only diuresed 400 mL last night -Increase Lasix to 40 mg twice daily -Soft blood pressures limit aggressive med titration -Cardiology following, no plan for ischemic evaluation at this time -Monitor I's/O, closely -Ambulate, PT eval  Elevated high-sensitivity troponin -In the 200 range, nonischemic cardiomyopathy, clinically do not suspect ACS although EF is lower, cardiology following  PVCs, bigeminy, nonsustained V. tach -Continues to have intermittent arrhythmias last night and this am -Secondary to severe cardiomyopathy -Metoprolol dose increased to 50 mg twice daily -Check mag level   DVT prophylaxis: Lovenox Code Status: Full code Family Communication: No family at bedside, will update spouse Disposition Plan:  Status is: Inpatient  Remains inpatient appropriate because:Inpatient level of care appropriate due to severity of illness   Dispo: The patient is from: Home              Anticipated d/c is to: Home              Patient currently is not medically stable to d/c.   Difficult to place patient No  Consultants:   Cardiology   Procedures:   Antimicrobials:    Subjective: -Breathing improving, not back to baseline yet, starting to urinate this morning  Objective: Vitals:   11/24/20 0500  11/24/20 0533 11/24/20 0800 11/24/20 1135  BP:  107/72 120/82 (!) 120/94  Pulse:  81 80 84  Resp:  20 16 18   Temp:  97.6 F (36.4 C) 98 F (36.7 C) 97.6 F (36.4 C)  TempSrc:  Oral Oral Oral  SpO2:  96% 100% 100%  Weight: 68.7 kg     Height:        Intake/Output Summary (Last 24 hours) at 11/24/2020 1157 Last data filed at 11/24/2020 1107 Gross per 24 hour  Intake 1111.5 ml  Output 1325 ml  Net -213.5 ml   Filed Weights   11/23/20 0801 11/23/20 1815 11/24/20 0500  Weight: 68 kg 67 kg 68.7 kg    Examination:  General exam: Pleasant, elderly male, sitting up in bed, awake alert oriented x3 no distress  HEENT: + JVD CVS: S1-S2, regular rate rhythm Lungs: Decreased breath sounds at both bases Abdomen: Soft, nontender, bowel sounds present Extremities: trace edema Skin: No rashes on exposed skin Psychiatry:  Mood & affect appropriate.     Data Reviewed:   CBC: Recent Labs  Lab 11/23/20 0808 11/24/20 0455  WBC 5.0 5.3  HGB 13.7 12.8*  HCT 40.4 37.6*  MCV 90.2 88.7  PLT 316 278   Basic Metabolic Panel: Recent Labs  Lab 11/23/20 0808 11/24/20 0455  NA 136 136  K 4.3 4.4  CL 94* 97*  CO2 32 29  GLUCOSE 109* 97  BUN 30* 31*  CREATININE 1.45* 1.36*  CALCIUM 9.1 8.7*   GFR: Estimated Creatinine Clearance: 42.1 mL/min (A) (by C-G formula based on SCr of 1.36 mg/dL (  H)). Liver Function Tests: Recent Labs  Lab 11/23/20 0808  AST 30  ALT 21  ALKPHOS 111  BILITOT 0.7  PROT 8.7*  ALBUMIN 3.5   No results for input(s): LIPASE, AMYLASE in the last 168 hours. No results for input(s): AMMONIA in the last 168 hours. Coagulation Profile: Recent Labs  Lab 11/23/20 0808  INR 1.1   Cardiac Enzymes: No results for input(s): CKTOTAL, CKMB, CKMBINDEX, TROPONINI in the last 168 hours. BNP (last 3 results) No results for input(s): PROBNP in the last 8760 hours. HbA1C: No results for input(s): HGBA1C in the last 72 hours. CBG: No results for input(s): GLUCAP  in the last 168 hours. Lipid Profile: No results for input(s): CHOL, HDL, LDLCALC, TRIG, CHOLHDL, LDLDIRECT in the last 72 hours. Thyroid Function Tests: No results for input(s): TSH, T4TOTAL, FREET4, T3FREE, THYROIDAB in the last 72 hours. Anemia Panel: No results for input(s): VITAMINB12, FOLATE, FERRITIN, TIBC, IRON, RETICCTPCT in the last 72 hours. Urine analysis: No results found for: COLORURINE, APPEARANCEUR, LABSPEC, PHURINE, GLUCOSEU, HGBUR, BILIRUBINUR, KETONESUR, PROTEINUR, UROBILINOGEN, NITRITE, LEUKOCYTESUR Sepsis Labs: @LABRCNTIP (procalcitonin:4,lacticidven:4)  ) Recent Results (from the past 240 hour(s))  Resp Panel by RT-PCR (Flu A&B, Covid) Nasopharyngeal Swab     Status: None   Collection Time: 11/23/20  8:18 AM   Specimen: Nasopharyngeal Swab; Nasopharyngeal(NP) swabs in vial transport medium  Result Value Ref Range Status   SARS Coronavirus 2 by RT PCR NEGATIVE NEGATIVE Final    Comment: (NOTE) SARS-CoV-2 target nucleic acids are NOT DETECTED.  The SARS-CoV-2 RNA is generally detectable in upper respiratory specimens during the acute phase of infection. The lowest concentration of SARS-CoV-2 viral copies this assay can detect is 138 copies/mL. A negative result does not preclude SARS-Cov-2 infection and should not be used as the sole basis for treatment or other patient management decisions. A negative result may occur with  improper specimen collection/handling, submission of specimen other than nasopharyngeal swab, presence of viral mutation(s) within the areas targeted by this assay, and inadequate number of viral copies(<138 copies/mL). A negative result must be combined with clinical observations, patient history, and epidemiological information. The expected result is Negative.  Fact Sheet for Patients:  11/25/20  Fact Sheet for Healthcare Providers:  BloggerCourse.com  This test is no t yet  approved or cleared by the [employer] FDA and  has been authorized for detection and/or diagnosis of SARS-CoV-2 by FDA under an Emergency Use Authorization (EUA). This EUA will remain  in effect (meaning this test can be used) for the duration of the COVID-19 declaration under Section 564(b)(1) of the Act, 21 U.S.C.section 360bbb-3(b)(1), unless the authorization is terminated  or revoked sooner.       Influenza A by PCR NEGATIVE NEGATIVE Final   Influenza B by PCR NEGATIVE NEGATIVE Final    Comment: (NOTE) The Xpert Xpress SARS-CoV-2/FLU/RSV plus assay is intended as an aid in the diagnosis of influenza from Nasopharyngeal swab specimens and should not be used as a sole basis for treatment. Nasal washings and aspirates are unacceptable for Xpert Xpress SARS-CoV-2/FLU/RSV testing.  Fact Sheet for Patients: [place]  Fact Sheet for Healthcare Providers: BloggerCourse.com  This test is not yet approved or cleared by the [employer] FDA and has been authorized for detection and/or diagnosis of SARS-CoV-2 by FDA under an Emergency Use Authorization (EUA). This EUA will remain in effect (meaning this test can be used) for the duration of the COVID-19 declaration under Section 564(b)(1) of the Act, 21 U.S.C. section  360bbb-3(b)(1), unless the authorization is terminated or revoked.  Performed at [facility], [address] [place], [place] [address]          Radiology Studies: DG Chest Portable 1 View  Result Date: 11/23/2020 CLINICAL DATA:  Shortness of breath. EXAM: PORTABLE CHEST 1 VIEW COMPARISON:  August 18, 2020. FINDINGS: The heart size and mediastinal contours are within normal limits. No pneumothorax is noted. Increased bilateral pleural effusions are noted with increasing bibasilar edema or atelectasis. The visualized skeletal structures are unremarkable. IMPRESSION: Increased bilateral pleural  effusions with associated increased bibasilar atelectasis or edema. Electronically Signed   By: Lupita Raider M.D.   On: 11/23/2020 08:26   ECHOCARDIOGRAM COMPLETE  Result Date: 11/23/2020    ECHOCARDIOGRAM REPORT   Patient Name:   Todd Perez Date of Exam: 11/23/2020 Medical Rec #:  [id number]      Height:       73.0 in Accession #:    [id number]     Weight:       150.0 lb Date of Birth:  [birth year]     BSA:          1.904 m Patient Age:    79 years       BP:           117/85 mmHg Patient Gender: M              HR:           75 bpm. Exam Location:  [facility] Procedure: 2D Echo, Color Doppler and Cardiac Doppler Indications:     I50.21 CHF-Acute Systolic  History:         Patient has prior history of Echocardiogram examinations.                  Ischemic Cardiomyopathy and CHF, CAD; Risk Factors:Hypertension                  and Dyslipidemia.  Sonographer:     Humphrey Rolls RDCS (AE) Referring Phys:  [id number] [employer] Diagnosing Phys: Arnoldo Hooker MD IMPRESSIONS  1. Left ventricular ejection fraction, by estimation, is 25 to 30%. The left ventricle has severely decreased function. The left ventricle demonstrates global hypokinesis. The left ventricular internal cavity size was mildly dilated. There is severe left ventricular hypertrophy. Left ventricular diastolic parameters are consistent with Grade II diastolic dysfunction (pseudonormalization).  2. Right ventricular systolic function is normal. The right ventricular size is normal.  3. Left atrial size was moderately dilated.  4. Right atrial size was moderately dilated.  5. The mitral valve is normal in structure. Moderate mitral valve regurgitation.  6. Tricuspid valve regurgitation is moderate.  7. The aortic valve is normal in structure. Aortic valve regurgitation is mild. FINDINGS  Left Ventricle: Left ventricular ejection fraction, by estimation, is 25 to 30%. The left ventricle has severely decreased function. The left ventricle demonstrates global  hypokinesis. The left ventricular internal cavity size was mildly dilated. There is severe left ventricular hypertrophy. Left ventricular diastolic parameters are consistent with Grade II diastolic dysfunction (pseudonormalization). Right Ventricle: The right ventricular size is normal. No increase in right ventricular wall thickness. Right ventricular systolic function is normal. Left Atrium: Left atrial size was moderately dilated. Right Atrium: Right atrial size was moderately dilated. Pericardium: There is no evidence of pericardial effusion. Mitral Valve: The mitral valve is normal in structure. Moderate mitral valve regurgitation. MV peak gradient, 4.5 mmHg. The mean mitral valve gradient is  2.0 mmHg. Tricuspid Valve: The tricuspid valve is normal in structure. Tricuspid valve regurgitation is moderate. Aortic Valve: The aortic valve is normal in structure. Aortic valve regurgitation is mild. Aortic regurgitation PHT measures 874 msec. Aortic valve mean gradient measures 2.0 mmHg. Aortic valve peak gradient measures 4.2 mmHg. Aortic valve area, by VTI measures 2.23 cm. Pulmonic Valve: The pulmonic valve was normal in structure. Pulmonic valve regurgitation is trivial. Aorta: The aortic root and ascending aorta are structurally normal, with no evidence of dilitation. IAS/Shunts: No atrial level shunt detected by color flow Doppler.  LEFT VENTRICLE PLAX 2D LVIDd:         3.80 cm  Diastology LVIDs:         3.10 cm  LV e' lateral:   4.35 cm/s LV PW:         2.00 cm  LV E/e' lateral: 26.2 LV IVS:        1.40 cm LVOT diam:     2.00 cm LV SV:         39 LV SV Index:   20 LVOT Area:     3.14 cm  RIGHT VENTRICLE RV Basal diam:  3.40 cm LEFT ATRIUM           Index       RIGHT ATRIUM           Index LA diam:      3.70 cm 1.94 cm/m  RA Area:     20.70 cm LA Vol (A4C): 73.4 ml 38.55 ml/m RA Volume:   56.30 ml  29.57 ml/m  AORTIC VALVE                   PULMONIC VALVE AV Area (Vmax):    2.03 cm    PV Vmax:       0.66  m/s AV Area (Vmean):   2.04 cm    PV Vmean:      38.800 cm/s AV Area (VTI):     2.23 cm    PV VTI:        0.073 m AV Vmax:           103.00 cm/s PV Peak grad:  1.7 mmHg AV Vmean:          72.200 cm/s PV Mean grad:  1.0 mmHg AV VTI:            0.175 m AV Peak Grad:      4.2 mmHg AV Mean Grad:      2.0 mmHg LVOT Vmax:         66.60 cm/s LVOT Vmean:        46.900 cm/s LVOT VTI:          0.124 m LVOT/AV VTI ratio: 0.71 AI PHT:            874 msec  AORTA Ao Root diam: 4.00 cm MITRAL VALVE MV Area (PHT): 4.36 cm     SHUNTS MV Area VTI:   1.60 cm     Systemic VTI:  0.12 m MV Peak grad:  4.5 mmHg     Systemic Diam: 2.00 cm MV Mean grad:  2.0 mmHg MV Vmax:       1.06 m/s MV Vmean:      56.5 cm/s MV Decel Time: 174 msec MV E velocity: 114.00 cm/s MV A velocity: 42.65 cm/s MV E/A ratio:  2.67 Arnoldo Hooker MD Electronically signed by Arnoldo Hooker MD Signature Date/Time: 11/23/2020/1:23:23 PM    Final  Scheduled Meds: . aspirin  81 mg Oral Daily  . atorvastatin  40 mg Oral Daily  . enoxaparin (LOVENOX) injection  40 mg Subcutaneous Q24H  . furosemide  40 mg Intravenous Q12H  . metoprolol succinate  50 mg Oral Daily  . sodium chloride flush  3 mL Intravenous Q12H   Continuous Infusions: . sodium chloride       LOS: 1 day    Time spent:  Zannie Cove, MD [facility]  11/24/2020, 11:57 AM

## 2020-11-25 DIAGNOSIS — I5023 Acute on chronic systolic (congestive) heart failure: Secondary | ICD-10-CM | POA: Diagnosis not present

## 2020-11-25 LAB — BASIC METABOLIC PANEL
Anion gap: 9 (ref 5–15)
BUN: 31 mg/dL — ABNORMAL HIGH (ref 8–23)
CO2: 32 mmol/L (ref 22–32)
Calcium: 8.8 mg/dL — ABNORMAL LOW (ref 8.9–10.3)
Chloride: 93 mmol/L — ABNORMAL LOW (ref 98–111)
Creatinine, Ser: 1.48 mg/dL — ABNORMAL HIGH (ref 0.61–1.24)
GFR, Estimated: 48 mL/min — ABNORMAL LOW (ref >60)
Glucose, Bld: 116 mg/dL — ABNORMAL HIGH (ref 70–99)
Potassium: 3.9 mmol/L (ref 3.5–5.1)
Sodium: 134 mmol/L — ABNORMAL LOW (ref 135–145)

## 2020-11-25 MED ORDER — POTASSIUM CHLORIDE ER 20 MEQ PO TBCR: 20.0000 meq | EXTENDED_RELEASE_TABLET | Freq: Every day | ORAL | 0 refills | Status: DC

## 2020-11-25 MED ORDER — METOPROLOL SUCCINATE ER 25 MG PO TB24: 50.0000 mg | ORAL_TABLET | Freq: Every day | ORAL | 0 refills | Status: DC

## 2020-11-25 MED ORDER — ASPIRIN 81 MG PO CHEW: 81.0000 mg | CHEWABLE_TABLET | Freq: Every day | ORAL | Status: DC

## 2020-11-25 MED ORDER — FUROSEMIDE 40 MG PO TABS: 40.0000 mg | ORAL_TABLET | Freq: Every day | ORAL | 0 refills | Status: DC

## 2020-11-25 MED ORDER — ATORVASTATIN CALCIUM 20 MG PO TABS: 40.0000 mg | ORAL_TABLET | Freq: Every day | ORAL | 0 refills | Status: DC

## 2020-11-25 NOTE — Progress Notes (Signed)
Patient is adequate for discharge for Dr. Jomarie Longs. AVS reviewed, no questions or concerns at this time. Patient states daughter will pick him up. IV removed, telemetry notified, charge nurse notified. VSS.

## 2020-11-25 NOTE — Discharge Summary (Signed)
Physician Discharge Summary  Todd Perez DJT:[id number] DOB: 1941-05-18 DOA: 11/23/2020  PCP: [name], MD  Admit date: 11/23/2020 Discharge date: 11/25/2020  Time spent: 35 minutes  Recommendations for Outpatient Follow-up:  1. Cardiology Dr. Lady Gary in 2 weeks, please consider further medical management as outpatient as blood pressure tolerates, check BMP in 1 week    Discharge Diagnoses:  Principal Problem:   Acute on chronic systolic CHF (congestive heart failure) (HCC) Ischemic cardiomyopathy, EF 25-30% Nonsustained V. tach, bigeminy   CAD (coronary artery disease)   Ischemic cardiomyopathy   Hypertension   Elevated troponin   Discharge Condition: Stable  Diet recommendation: Low-sodium, heart healthy  Filed Weights   11/24/20 0500 11/25/20 0500 11/25/20 0814  Weight: 68.7 kg 67.7 kg 66.4 kg    History of present illness:  80 year old male with history of CAD, ischemic cardiomyopathy, EF of 45% presented to the ED with worsening shortness of breath on 4/14. -In the ED he was noted to have elevated high-sensitivity troponin, elevated BNP and chest x-ray which noted increased bilateral pleural effusions -On telemetry has had multiple PVCs, bigeminy and nonsustained V. tach  Hospital Course:   Acute on chronic systolic CHF CAD, ischemic cardiomyopathy -LVEF down from 40-45% in 2022 down to 25-30% now -Clinically improved with diuresis -Soft blood pressures limit aggressive med titration -Seen by cardiology in consultation, no plan for ischemic evaluation at this time -Lasix transition to oral 40 mg daily, beta-blocker dose increased due to multiple runs of PVCs and nonsustained V. tach, NSAIDs discontinued at discharge, soft blood pressure limits further addition of ACE/ARNI etc. -Close follow-up with cardiology Dr. Lady Gary in 2 weeks, further medical management as outpatient  Elevated high-sensitivity troponin -In the 200 range,  felt to be secondary to demand  from fluid overload  PVCs, bigeminy, nonsustained V. tach -Secondary to severe cardiomyopathy -Metoprolol dose increased to 50 mg twice daily, mag level is normal -Follow-up with cardiology in 2 weeks    Consultations:  Cardiology Dr. Gwen Pounds  Discharge Exam: Vitals:   11/25/20 0814 11/25/20 1134  BP: 107/83 105/80  Pulse: 79 79  Resp: 18 20  Temp: 97.6 F (36.4 C) 97.9 F (36.6 C)  SpO2: 99% 99%    General: AAOx3 Cardiovascular: S1S2/RRR Respiratory: CTAB  Discharge Instructions   Discharge Instructions    Diet - low sodium heart healthy   Complete by: As directed    Increase activity slowly   Complete by: As directed      Allergies as of 11/25/2020      Reactions   Benicar Hct [olmesartan Medoxomil-hctz] Itching   Lisinopril Cough      Medication List    STOP taking these medications   chlorpheniramine-HYDROcodone 10-8 MG/5ML Suer Commonly known as: TUSSIONEX   levofloxacin 750 MG tablet Commonly known as: Levaquin   naproxen 250 MG tablet Commonly known as: NAPROSYN     TAKE these medications   allopurinol 100 MG tablet Commonly known as: ZYLOPRIM Take 100 mg by mouth daily as needed (gout prevention).   aspirin 81 MG chewable tablet Chew 1 tablet (81 mg total) by mouth daily. Start taking on: November 26, 2020   atorvastatin 20 MG tablet Commonly known as: LIPITOR Take 2 tablets (40 mg total) by mouth daily. Start taking on: November 26, 2020   furosemide 40 MG tablet Commonly known as: Lasix Take 1 tablet (40 mg total) by mouth daily. What changed:   medication strength  how much to take  when to  take this  reasons to take this   metoprolol succinate 25 MG 24 hr tablet Commonly known as: TOPROL-XL Take 2 tablets (50 mg total) by mouth daily. What changed: how much to take   Potassium Chloride ER 20 MEQ Tbcr Take 20 mEq by mouth daily.   Trelegy Ellipta 100-62.5-25 MCG/INH Aepb Generic drug:  Fluticasone-Umeclidin-Vilant Inhale 1 puff into the lungs daily.      Allergies  Allergen Reactions  . Benicar Hct [Olmesartan Medoxomil-Hctz] Itching  . Lisinopril Cough    Follow-up Information    Dalia HeadingFath, Kenneth A, MD Follow up in 10 day(s).   Specialty: Cardiology Contact information: [address] [place] [id number] [phone]                The results of significant diagnostics from this hospitalization (including imaging, microbiology, ancillary and laboratory) are listed below for reference.    Significant Diagnostic Studies: CT CHEST WO CONTRAST  Result Date: 11/22/2020 CLINICAL DATA:  Worsening shortness of breath, weight loss and recurrent pleural effusions. Status post prior bilateral thoracentesis procedures. EXAM: CT CHEST WITHOUT CONTRAST TECHNIQUE: Multidetector CT imaging of the chest was performed following the standard protocol without IV contrast. COMPARISON:  CT of the chest with contrast on 09/19/2020 FINDINGS: Cardiovascular: Moderate cardiac enlargement again noted. The ascending thoracic aorta shows mild dilatation measuring up to 4.2 cm in maximum diameter. No pericardial fluid identified. Stable calcified plaque in the distribution of the LAD and left circumflex coronary arteries. Stable focal calcification at the level of the aortic valve and mitral annular calcification. Mediastinum/Nodes: Stable mildly prominent precarinal lymph node measuring up to 12 mm in short axis. Other smaller nonenlarged scattered mediastinal lymph nodes present. Lungs/Pleura: Moderate right pleural effusion and small left pleural effusion. Lungs demonstrate some increase in generalized mild alveolar ground-glass opacities mostly in the upper lung zones. Findings support probable underlying heart failure. There remains stable atelectasis and scarring in both lower lobes. No pneumothorax or obvious pulmonary or pleural masses. Upper Abdomen: No acute abnormality.  Musculoskeletal: No chest wall mass or suspicious bone lesions identified. IMPRESSION: 1. Moderate right pleural effusion and small left pleural effusion. 2. Slight increase in generalized mild alveolar ground-glass opacities mostly in the upper lung zones. Findings support probable underlying heart failure. 3. Stable mildly prominent precarinal lymph node measuring up to 12 mm in short axis. 4. Coronary atherosclerosis with calcified plaque in the distribution of the LAD and left circumflex coronary arteries. 5. 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 Recommend annual imaging followup by CTA or MRA. This recommendation follows 2010 ACCF/AHA/AATS/ACR/ASA/SCA/SCAI/SIR/STS/SVM Guidelines for the Diagnosis and Management of Patients with Thoracic Aortic Disease. Circulation. 2010; 121: B147-W295: E266-e369. 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 Electronically Signed   By: Irish LackGlenn  Yamagata M.D.   On: 11/22/2020 09:31   DG Chest Portable 1 View  Result Date: 11/23/2020 CLINICAL DATA:  Shortness of breath. EXAM: PORTABLE CHEST 1 VIEW COMPARISON:  August 18, 2020. FINDINGS: The heart size and mediastinal contours are within normal limits. No pneumothorax is noted. Increased bilateral pleural effusions are noted with increasing bibasilar edema or atelectasis. The visualized skeletal structures are unremarkable. IMPRESSION: Increased bilateral pleural effusions with associated increased bibasilar atelectasis or edema. Electronically Signed   By: Lupita RaiderJames  Green Jr M.D.   On: 11/23/2020 08:26   ECHOCARDIOGRAM COMPLETE  Result Date: 11/23/2020    ECHOCARDIOGRAM REPORT   Patient Name:   Todd Perez  Date of Exam: 11/23/2020 Medical Rec #:  [id number]      Height:       73.0 in Accession #:    [id number]     Weight:       150.0 lb Date of Birth:  20-Apr-1941     BSA:          1.904 m Patient Age:    79 years       BP:           117/85 mmHg Patient  Gender: M              HR:           75 bpm. Exam Location:  [facility] Procedure: 2D Echo, Color Doppler and Cardiac Doppler Indications:     I50.21 CHF-Acute Systolic  History:         Patient has prior history of Echocardiogram examinations.                  Ischemic Cardiomyopathy and CHF, CAD; Risk Factors:Hypertension                  and Dyslipidemia.  Sonographer:     Humphrey Rolls RDCS (AE) Referring Phys:  [id number] [employer] Shutters Diagnosing Phys: Arnoldo Hooker MD IMPRESSIONS  1. Left ventricular ejection fraction, by estimation, is 25 to 30%. The left ventricle has severely decreased function. The left ventricle demonstrates global hypokinesis. The left ventricular internal cavity size was mildly dilated. There is severe left ventricular hypertrophy. Left ventricular diastolic parameters are consistent with Grade II diastolic dysfunction (pseudonormalization).  2. Right ventricular systolic function is normal. The right ventricular size is normal.  3. Left atrial size was moderately dilated.  4. Right atrial size was moderately dilated.  5. The mitral valve is normal in structure. Moderate mitral valve regurgitation.  6. Tricuspid valve regurgitation is moderate.  7. The aortic valve is normal in structure. Aortic valve regurgitation is mild. FINDINGS  Left Ventricle: Left ventricular ejection fraction, by estimation, is 25 to 30%. The left ventricle has severely decreased function. The left ventricle demonstrates global hypokinesis. The left ventricular internal cavity size was mildly dilated. There is severe left ventricular hypertrophy. Left ventricular diastolic parameters are consistent with Grade II diastolic dysfunction (pseudonormalization). Right Ventricle: The right ventricular size is normal. No increase in right ventricular wall thickness. Right ventricular systolic function is normal. Left Atrium: Left atrial size was moderately dilated. Right Atrium: Right atrial size was moderately dilated.  Pericardium: There is no evidence of pericardial effusion. Mitral Valve: The mitral valve is normal in structure. Moderate mitral valve regurgitation. MV peak gradient, 4.5 mmHg. The mean mitral valve gradient is 2.0 mmHg. Tricuspid Valve: The tricuspid valve is normal in structure. Tricuspid valve regurgitation is moderate. Aortic Valve: The aortic valve is normal in structure. Aortic valve regurgitation is mild. Aortic regurgitation PHT measures 874 msec. Aortic valve mean gradient measures 2.0 mmHg. Aortic valve peak gradient measures 4.2 mmHg. Aortic valve area, by VTI measures 2.23 cm. Pulmonic Valve: The pulmonic valve was normal in structure. Pulmonic valve regurgitation is trivial. Aorta: The aortic root and ascending aorta are structurally normal, with no evidence of dilitation. IAS/Shunts: No atrial level shunt detected by color flow Doppler.  LEFT VENTRICLE PLAX 2D LVIDd:         3.80 cm  Diastology LVIDs:         3.10 cm  LV e' lateral:   4.35 cm/s LV PW:  2.00 cm  LV E/e' lateral: 26.2 LV IVS:        1.40 cm LVOT diam:     2.00 cm LV SV:         39 LV SV Index:   20 LVOT Area:     3.14 cm  RIGHT VENTRICLE RV Basal diam:  3.40 cm LEFT ATRIUM           Index       RIGHT ATRIUM           Index LA diam:      3.70 cm 1.94 cm/m  RA Area:     20.70 cm LA Vol (A4C): 73.4 ml 38.55 ml/m RA Volume:   56.30 ml  29.57 ml/m  AORTIC VALVE                   PULMONIC VALVE AV Area (Vmax):    2.03 cm    PV Vmax:       0.66 m/s AV Area (Vmean):   2.04 cm    PV Vmean:      38.800 cm/s AV Area (VTI):     2.23 cm    PV VTI:        0.073 m AV Vmax:           103.00 cm/s PV Peak grad:  1.7 mmHg AV Vmean:          72.200 cm/s PV Mean grad:  1.0 mmHg AV VTI:            0.175 m AV Peak Grad:      4.2 mmHg AV Mean Grad:      2.0 mmHg LVOT Vmax:         66.60 cm/s LVOT Vmean:        46.900 cm/s LVOT VTI:          0.124 m LVOT/AV VTI ratio: 0.71 AI PHT:            874 msec  AORTA Ao Root diam: 4.00 cm MITRAL VALVE MV  Area (PHT): 4.36 cm     SHUNTS MV Area VTI:   1.60 cm     Systemic VTI:  0.12 m MV Peak grad:  4.5 mmHg     Systemic Diam: 2.00 cm MV Mean grad:  2.0 mmHg MV Vmax:       1.06 m/s MV Vmean:      56.5 cm/s MV Decel Time: 174 msec MV E velocity: 114.00 cm/s MV A velocity: 42.65 cm/s MV E/A ratio:  2.67 Arnoldo Hooker MD Electronically signed by Arnoldo Hooker MD Signature Date/Time: 11/23/2020/1:23:23 PM    Final     Microbiology: Recent Results (from the past 240 hour(s))  Resp Panel by RT-PCR (Flu A&B, Covid) Nasopharyngeal Swab     Status: None   Collection Time: 11/23/20  8:18 AM   Specimen: Nasopharyngeal Swab; Nasopharyngeal(NP) swabs in vial transport medium  Result Value Ref Range Status   SARS Coronavirus 2 by RT PCR NEGATIVE NEGATIVE Final    Comment: (NOTE) SARS-CoV-2 target nucleic acids are NOT DETECTED.  The SARS-CoV-2 RNA is generally detectable in upper respiratory specimens during the acute phase of infection. The lowest concentration of SARS-CoV-2 viral copies this assay can detect is 138 copies/mL. A negative result does not preclude SARS-Cov-2 infection and should not be used as the sole basis for treatment or other patient management decisions. A negative result may occur with  improper specimen collection/handling, submission of specimen other  than nasopharyngeal swab, presence of viral mutation(s) within the areas targeted by this assay, and inadequate number of viral copies(<138 copies/mL). A negative result must be combined with clinical observations, patient history, and epidemiological information. The expected result is Negative.  Fact Sheet for Patients:  BloggerCourse.com  Fact Sheet for Healthcare Providers:  SeriousBroker.it  This test is no t yet approved or cleared by the [place] FDA and  has been authorized for detection and/or diagnosis of SARS-CoV-2 by FDA under an Emergency Use Authorization  (EUA). This EUA will remain  in effect (meaning this test can be used) for the duration of the COVID-19 declaration under Section 564(b)(1) of the Act, 21 U.S.C.section 360bbb-3(b)(1), unless the authorization is terminated  or revoked sooner.       Influenza A by PCR NEGATIVE NEGATIVE Final   Influenza B by PCR NEGATIVE NEGATIVE Final    Comment: (NOTE) The Xpert Xpress SARS-CoV-2/FLU/RSV plus assay is intended as an aid in the diagnosis of influenza from Nasopharyngeal swab specimens and should not be used as a sole basis for treatment. Nasal washings and aspirates are unacceptable for Xpert Xpress SARS-CoV-2/FLU/RSV testing.  Fact Sheet for Patients: BloggerCourse.com  Fact Sheet for Healthcare Providers: SeriousBroker.it  This test is not yet approved or cleared by the [place] FDA and has been authorized for detection and/or diagnosis of SARS-CoV-2 by FDA under an Emergency Use Authorization (EUA). This EUA will remain in effect (meaning this test can be used) for the duration of the COVID-19 declaration under Section 564(b)(1) of the Act, 21 U.S.C. section 360bbb-3(b)(1), unless the authorization is terminated or revoked.  Performed at [facility], [address] [place], [place] [address]      Labs: Basic Metabolic Panel: Recent Labs  Lab 11/23/20 0808 11/24/20 0455 11/25/20 0445  NA 136 136 134*  K 4.3 4.4 3.9  CL 94* 97* 93*  CO2 32 29 32  GLUCOSE 109* 97 116*  BUN 30* 31* 31*  CREATININE 1.45* 1.36* 1.48*  CALCIUM 9.1 8.7* 8.8*  MG  --  2.0  --    Liver Function Tests: Recent Labs  Lab 11/23/20 0808  AST 30  ALT 21  ALKPHOS 111  BILITOT 0.7  PROT 8.7*  ALBUMIN 3.5   No results for input(s): LIPASE, AMYLASE in the last 168 hours. No results for input(s): AMMONIA in the last 168 hours. CBC: Recent Labs  Lab 11/23/20 0808 11/24/20 0455  WBC 5.0 5.3  HGB 13.7 12.8*  HCT  40.4 37.6*  MCV 90.2 88.7  PLT 316 278   Cardiac Enzymes: No results for input(s): CKTOTAL, CKMB, CKMBINDEX, TROPONINI in the last 168 hours. BNP: BNP (last 3 results) Recent Labs    08/15/20 1610 11/23/20 0808  BNP 885.6* 782.8*    ProBNP (last 3 results) No results for input(s): PROBNP in the last 8760 hours.  CBG: No results for input(s): GLUCAP in the last 168 hours.  Signed:  Zannie Cove MD.  [facility] 11/25/2020, 3:43 PM

## 2020-11-27 ENCOUNTER — Telehealth: Payer: Self-pay

## 2020-11-27 NOTE — Telephone Encounter (Signed)
   Post hospital discharge phone call.   Spoke with patient, he states that he continues to lose weight.  He is weighing himself daily, denies any increasing shortness of breath, PND or orthopnea.  Discussed his medication, reinforced that his metoprolol succinate 25 mg tablet should be taken 2 tablets daily and with the furosemide 20 mg tablets to be taken to tablets once a day.  Discussed the reasoning behind discontinuing the naproxen.  States that he has not taking Tussionex or Levaquin.   He questioned if he should be taking the spironolactone, told him that it is not on his current list and put it away in another cupboard, incase doctor would want to start at another time.  States that he was unable to fill the atorvastatin prescription after being discharged from the hospital due to the prescription being written incorrectly per the pharmacist.  Instructed to call Dr. America Brown office for an appointment and to make him aware that he did had not started the Lipitor.  Voices understanding.  Stressed the importance of being established with the heart failure clinic.  He has an appointment for April 29.  Tresa Endo RN [facility]

## 2020-12-08 ENCOUNTER — Ambulatory Visit: Payer: Medicare Other | Admitting: Family

## 2021-04-10 ENCOUNTER — Emergency Department: Payer: Medicare Other

## 2021-04-10 ENCOUNTER — Other Ambulatory Visit: Payer: Self-pay

## 2021-04-10 ENCOUNTER — Inpatient Hospital Stay
Admission: EM | Admit: 2021-04-10 | Discharge: 2021-04-14 | DRG: 291 | Disposition: A | Discharge status: Home / Self Care | Payer: Medicare Other | Attending: Family Medicine | Admitting: Family Medicine

## 2021-04-10 DIAGNOSIS — J90 Pleural effusion, not elsewhere classified: Secondary | ICD-10-CM | POA: Diagnosis not present

## 2021-04-10 DIAGNOSIS — R0602 Shortness of breath: Secondary | ICD-10-CM

## 2021-04-10 DIAGNOSIS — I440 Atrioventricular block, first degree: Secondary | ICD-10-CM | POA: Diagnosis present

## 2021-04-10 DIAGNOSIS — B370 Candidal stomatitis: Secondary | ICD-10-CM | POA: Diagnosis present

## 2021-04-10 DIAGNOSIS — Z801 Family history of malignant neoplasm of trachea, bronchus and lung: Secondary | ICD-10-CM

## 2021-04-10 DIAGNOSIS — I214 Non-ST elevation (NSTEMI) myocardial infarction: Secondary | ICD-10-CM | POA: Diagnosis present

## 2021-04-10 DIAGNOSIS — Z841 Family history of disorders of kidney and ureter: Secondary | ICD-10-CM

## 2021-04-10 DIAGNOSIS — M109 Gout, unspecified: Secondary | ICD-10-CM | POA: Diagnosis present

## 2021-04-10 DIAGNOSIS — Z7982 Long term (current) use of aspirin: Secondary | ICD-10-CM

## 2021-04-10 DIAGNOSIS — I2510 Atherosclerotic heart disease of native coronary artery without angina pectoris: Secondary | ICD-10-CM | POA: Diagnosis present

## 2021-04-10 DIAGNOSIS — E785 Hyperlipidemia, unspecified: Secondary | ICD-10-CM | POA: Diagnosis present

## 2021-04-10 DIAGNOSIS — Z825 Family history of asthma and other chronic lower respiratory diseases: Secondary | ICD-10-CM

## 2021-04-10 DIAGNOSIS — I509 Heart failure, unspecified: Secondary | ICD-10-CM

## 2021-04-10 DIAGNOSIS — J918 Pleural effusion in other conditions classified elsewhere: Secondary | ICD-10-CM | POA: Diagnosis present

## 2021-04-10 DIAGNOSIS — U071 COVID-19: Secondary | ICD-10-CM | POA: Diagnosis present

## 2021-04-10 DIAGNOSIS — J95811 Postprocedural pneumothorax: Secondary | ICD-10-CM | POA: Diagnosis not present

## 2021-04-10 DIAGNOSIS — I255 Ischemic cardiomyopathy: Secondary | ICD-10-CM | POA: Diagnosis present

## 2021-04-10 DIAGNOSIS — I110 Hypertensive heart disease with heart failure: Principal | ICD-10-CM | POA: Diagnosis present

## 2021-04-10 DIAGNOSIS — E43 Unspecified severe protein-calorie malnutrition: Secondary | ICD-10-CM | POA: Insufficient documentation

## 2021-04-10 DIAGNOSIS — J939 Pneumothorax, unspecified: Secondary | ICD-10-CM

## 2021-04-10 DIAGNOSIS — I081 Rheumatic disorders of both mitral and tricuspid valves: Secondary | ICD-10-CM | POA: Diagnosis present

## 2021-04-10 DIAGNOSIS — Z888 Allergy status to other drugs, medicaments and biological substances status: Secondary | ICD-10-CM

## 2021-04-10 DIAGNOSIS — I420 Dilated cardiomyopathy: Secondary | ICD-10-CM | POA: Diagnosis present

## 2021-04-10 DIAGNOSIS — Z79899 Other long term (current) drug therapy: Secondary | ICD-10-CM

## 2021-04-10 DIAGNOSIS — Y848 Other medical procedures as the cause of abnormal reaction of the patient, or of later complication, without mention of misadventure at the time of the procedure: Secondary | ICD-10-CM | POA: Diagnosis not present

## 2021-04-10 DIAGNOSIS — J440 Chronic obstructive pulmonary disease with acute lower respiratory infection: Secondary | ICD-10-CM | POA: Diagnosis present

## 2021-04-10 DIAGNOSIS — I248 Other forms of acute ischemic heart disease: Secondary | ICD-10-CM | POA: Diagnosis present

## 2021-04-10 DIAGNOSIS — Z6820 Body mass index (BMI) 20.0-20.9, adult: Secondary | ICD-10-CM

## 2021-04-10 DIAGNOSIS — I5023 Acute on chronic systolic (congestive) heart failure: Secondary | ICD-10-CM | POA: Diagnosis present

## 2021-04-10 DIAGNOSIS — G629 Polyneuropathy, unspecified: Secondary | ICD-10-CM | POA: Diagnosis present

## 2021-04-10 DIAGNOSIS — I10 Essential (primary) hypertension: Secondary | ICD-10-CM | POA: Diagnosis present

## 2021-04-10 DIAGNOSIS — Z9889 Other specified postprocedural states: Secondary | ICD-10-CM

## 2021-04-10 DIAGNOSIS — I4510 Unspecified right bundle-branch block: Secondary | ICD-10-CM | POA: Diagnosis present

## 2021-04-10 DIAGNOSIS — Z87891 Personal history of nicotine dependence: Secondary | ICD-10-CM

## 2021-04-10 DIAGNOSIS — R778 Other specified abnormalities of plasma proteins: Secondary | ICD-10-CM | POA: Diagnosis present

## 2021-04-10 LAB — BASIC METABOLIC PANEL
Anion gap: 8 (ref 5–15)
BUN: 28 mg/dL — ABNORMAL HIGH (ref 8–23)
CO2: 32 mmol/L (ref 22–32)
Calcium: 8.8 mg/dL — ABNORMAL LOW (ref 8.9–10.3)
Chloride: 97 mmol/L — ABNORMAL LOW (ref 98–111)
Creatinine, Ser: 1.09 mg/dL (ref 0.61–1.24)
GFR, Estimated: >60 mL/min (ref >60)
Glucose, Bld: 114 mg/dL — ABNORMAL HIGH (ref 70–99)
Potassium: 4 mmol/L (ref 3.5–5.1)
Sodium: 137 mmol/L (ref 135–145)

## 2021-04-10 LAB — APTT: aPTT: 30 s (ref 24–36)

## 2021-04-10 LAB — CBC
HCT: 38.7 % — ABNORMAL LOW (ref 39.0–52.0)
Hemoglobin: 13.2 g/dL (ref 13.0–17.0)
MCH: 32.5 pg (ref 26.0–34.0)
MCHC: 34.1 g/dL (ref 30.0–36.0)
MCV: 95.3 fL (ref 80.0–100.0)
Platelets: 211 10*3/uL (ref 150–400)
RBC: 4.06 MIL/uL — ABNORMAL LOW (ref 4.22–5.81)
RDW: 14.5 % (ref 11.5–15.5)
WBC: 5.3 10*3/uL (ref 4.0–10.5)
nRBC: 0 % (ref 0.0–0.2)

## 2021-04-10 LAB — PROTIME-INR
INR: 1.1 (ref 0.8–1.2)
Prothrombin Time: 14.5 s (ref 11.4–15.2)

## 2021-04-10 LAB — RESP PANEL BY RT-PCR (FLU A&B, COVID) ARPGX2
Influenza A by PCR: NEGATIVE
Influenza B by PCR: NEGATIVE
SARS Coronavirus 2 by RT PCR: POSITIVE — AB

## 2021-04-10 LAB — BRAIN NATRIURETIC PEPTIDE: B Natriuretic Peptide: 1127.9 pg/mL — ABNORMAL HIGH (ref 0.0–100.0)

## 2021-04-10 LAB — TROPONIN I (HIGH SENSITIVITY)
Troponin I (High Sensitivity): 310 ng/L (ref <18)
Troponin I (High Sensitivity): 318 ng/L (ref <18)
Troponin I (High Sensitivity): 284 ng/L

## 2021-04-10 LAB — PROCALCITONIN: Procalcitonin: <0.1 ng/mL

## 2021-04-10 MED ORDER — NITROGLYCERIN 2 % TD OINT
1.0000 [in_us] | TOPICAL_OINTMENT | Freq: Four times a day (QID) | TRANSDERMAL | Status: DC | PRN
Start: 2021-04-10 — End: 2021-04-14

## 2021-04-10 MED ORDER — GUAIFENESIN-DM 100-10 MG/5ML PO SYRP: 10.0000 mL | ORAL_SOLUTION | ORAL | Status: DC | PRN

## 2021-04-10 MED ORDER — HEPARIN BOLUS VIA INFUSION
4000.0000 [IU] | Freq: Once | INTRAVENOUS | Status: AC
  Administered 2021-04-10: 4000 [IU] via INTRAVENOUS
  Filled 2021-04-10: qty 4000

## 2021-04-10 MED ORDER — METOPROLOL SUCCINATE ER 25 MG PO TB24
25.0000 mg | ORAL_TABLET | Freq: Every day | ORAL | Status: DC
  Administered 2021-04-11 – 2021-04-14 (×4): 25 mg via ORAL
  Filled 2021-04-10 (×4): qty 1

## 2021-04-10 MED ORDER — SPIRONOLACTONE 25 MG PO TABS
25.0000 mg | ORAL_TABLET | Freq: Every day | ORAL | Status: DC
  Administered 2021-04-11 – 2021-04-13 (×3): 25 mg via ORAL
  Filled 2021-04-10 (×3): qty 1

## 2021-04-10 MED ORDER — FUROSEMIDE 10 MG/ML IJ SOLN
40.0000 mg | Freq: Once | INTRAMUSCULAR | Status: AC
  Administered 2021-04-10: 40 mg via INTRAVENOUS
  Filled 2021-04-10: qty 4

## 2021-04-10 MED ORDER — ENSURE ENLIVE PO LIQD: 237.0000 mL | Freq: Two times a day (BID) | ORAL | Status: DC

## 2021-04-10 MED ORDER — HEPARIN (PORCINE) 25000 UT/250ML-% IV SOLN
800.0000 [IU]/h | INTRAVENOUS | Status: DC
  Administered 2021-04-10: 800 [IU]/h via INTRAVENOUS
  Filled 2021-04-10: qty 250

## 2021-04-10 MED ORDER — SODIUM CHLORIDE 0.9 % IV SOLN
200.0000 mg | Freq: Once | INTRAVENOUS | Status: AC
  Administered 2021-04-10: 200 mg via INTRAVENOUS
  Filled 2021-04-10: qty 200

## 2021-04-10 MED ORDER — ALLOPURINOL 100 MG PO TABS
100.0000 mg | ORAL_TABLET | Freq: Every day | ORAL | Status: DC | PRN
  Filled 2021-04-10: qty 1

## 2021-04-10 MED ORDER — ALBUTEROL SULFATE HFA 108 (90 BASE) MCG/ACT IN AERS
2.0000 | INHALATION_SPRAY | RESPIRATORY_TRACT | Status: DC | PRN
  Administered 2021-04-11 – 2021-04-12 (×2): 2 via RESPIRATORY_TRACT
  Filled 2021-04-10 (×2): qty 6.7

## 2021-04-10 MED ORDER — ATORVASTATIN CALCIUM 20 MG PO TABS
40.0000 mg | ORAL_TABLET | Freq: Every day | ORAL | Status: DC
  Administered 2021-04-10 – 2021-04-11 (×2): 40 mg via ORAL
  Filled 2021-04-10 (×2): qty 2

## 2021-04-10 MED ORDER — FLUTICASONE-UMECLIDIN-VILANT 100-62.5-25 MCG/INH IN AEPB: 1.0000 | INHALATION_SPRAY | Freq: Every day | RESPIRATORY_TRACT | Status: DC

## 2021-04-10 MED ORDER — ONDANSETRON HCL 4 MG PO TABS: 4.0000 mg | ORAL_TABLET | Freq: Four times a day (QID) | ORAL | Status: DC | PRN

## 2021-04-10 MED ORDER — SODIUM CHLORIDE 0.9 % IV SOLN
100.0000 mg | Freq: Every day | INTRAVENOUS | Status: DC
  Administered 2021-04-11: 100 mg via INTRAVENOUS
  Filled 2021-04-10: qty 100
  Filled 2021-04-10 (×2): qty 20

## 2021-04-10 MED ORDER — ACETAMINOPHEN 650 MG RE SUPP: 650.0000 mg | Freq: Four times a day (QID) | RECTAL | Status: AC | PRN

## 2021-04-10 MED ORDER — ONDANSETRON HCL 4 MG/2ML IJ SOLN: 4.0000 mg | Freq: Four times a day (QID) | INTRAMUSCULAR | Status: DC | PRN

## 2021-04-10 MED ORDER — FUROSEMIDE 10 MG/ML IJ SOLN
40.0000 mg | Freq: Two times a day (BID) | INTRAMUSCULAR | Status: AC
  Administered 2021-04-10 – 2021-04-11 (×2): 40 mg via INTRAVENOUS
  Filled 2021-04-10 (×2): qty 4

## 2021-04-10 MED ORDER — TIOTROPIUM BROMIDE MONOHYDRATE 2.5 MCG/ACT IN AERS: 2.0000 | INHALATION_SPRAY | Freq: Every day | RESPIRATORY_TRACT | Status: DC

## 2021-04-10 MED ORDER — NYSTATIN 100000 UNIT/ML MT SUSP: 5.0000 mL | Freq: Four times a day (QID) | OROMUCOSAL | Status: DC

## 2021-04-10 MED ORDER — ACETAMINOPHEN 500 MG PO TABS: 1000.0000 mg | ORAL_TABLET | Freq: Four times a day (QID) | ORAL | Status: AC | PRN

## 2021-04-10 NOTE — ED Triage Notes (Signed)
Pt here with SOB and a cough. Pt has a hx of HF and lung issues. Pt denies N/V/D. Pt denies pain as well.

## 2021-04-10 NOTE — Progress Notes (Signed)
Remdesivir - Pharmacy Brief Note   O:  ALT: N/A CXR: Interval improvement in interstitial edema. Persistent bilateral pleural effusions with bibasilar consolidation/atelectasis SpO2: 98% on RA   A/P:  Remdesivir 200 mg IVPB once followed by 100 mg IVPB daily x 4 days.   Raiford Noble, PharmD 04/10/2021 8:35 PM

## 2021-04-10 NOTE — H&P (Addendum)
History and Physical   Todd Perez YQI:[id number] DOB: 13-Oct-1940 DOA: 04/10/2021  PCP: [name], MD  Outpatient Specialists: Dr. Lady Gary, cardiology Patient coming from: Home  I have personally briefly reviewed patient's old medical records in [facility] EMR.  Chief Concern: Shortness of breath and cough  HPI: Todd Perez is a 80 y.o. male with medical history significant for hypertension, CAD, hyperlipidemia, history of gout, heart failure reduced ejection fraction, presents emergency department for chief concerns of shortness of breath and cough.  At bedside, he is able to tell his name, age, location of hospital and current calendar year.   He reports he has shortness of breath at baseline and he experienced worsening since Wedesday, 8/24 after mowing the grass.  He reports that at baseline, he was able to ambulate into the grocery store with minor aid of the cart. However, since last Thrusday, he has had to lean on the grocery cart more.  He further endorses that he is unable to lay flat and has had to increase pillows when sleeping at night.  Patient talks to daughter every day.  On Sunday, while talking to daughter, he states that he is not feeling well and likely has a cold.  Daughter, Erie Noe, at bedside states that patient hates to go the hospital and he called his daughter asking her to take  him to the hospital.   He denies fever, nausea, vomiting, chills, new cough, chest pain, abdominal pain, syncope, loss of consciousness, dysuria, hematuria, diarrhea.  He endorses weakness and poor PO intake over the same timeframe.  Social history: He lives with his wife. He is a former tobacco user, 3 packs per week and quit a long time ago. He denies etoh and recreatioanl drug use. 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.   Vaccination history: He is vaccinated for covid 19, 2 doses of Moderna  ROS: Constitutional: no weight change, no fever ENT/Mouth: no sore  throat, no rhinorrhea Eyes: no eye pain, no vision changes Cardiovascular: no chest pain, no dyspnea,  no edema, no palpitations Respiratory: no cough, no sputum, no wheezing Gastrointestinal: no nausea, no vomiting, no diarrhea, no constipation Genitourinary: no urinary incontinence, no dysuria, no hematuria Musculoskeletal: no arthralgias, no myalgias Skin: no skin lesions, no pruritus, Neuro: + weakness, no loss of consciousness, no syncope Psych: no anxiety, no depression, + decrease appetite Heme/Lymph: no bruising, no bleeding  ED Course: Discussed with emergency medicine provider, patient requiring hospitalization for chief concerns of shortness of breath, NSTEMI, new COVID infection.  Vitals in the emergency department was remarkable for temperature of 97.8, respiration rate of 18, heart rate 93, blood pressure 122/82, SPO2 of 97% on room air.  Labs in the emergency department was remarkable for sodium 137, potassium 4, chloride 97, bicarb 32, BUN 28, serum creatinine of 1.09, nonfasting blood glucose 114, WBC 5.3, hemoglobin 13.2, platelets 211, BNP 1127.9, troponins was 310 and increased to 318.  COVID test was positive.  ED provider ordered Lasix 40 mg IV.  Assessment/Plan  Principal Problem:   Shortness of breath Active Problems:   Acute on chronic systolic CHF (congestive heart failure) (HCC)   CAD (coronary artery disease)   Ischemic cardiomyopathy   Hypertension   Elevated troponin   NSTEMI (non-ST elevated myocardial infarction) (HCC)   Pleural effusion due to CHF (congestive heart failure) (HCC)   COVID-19 virus infection   # Shortness of breath-etiology work-up is in progress and multifactorial at this time -  COVID-pneumonia, NSTEMI, bilateral pleural effusion - Patient received furosemide 40 mg of IV Lasix.  I ordered additional furosemide 40 mg IV twice daily, 2 more doses ordered with the dose tonight at 2200. -Patient made minimal urine output after 40 mg  of IV Lasix -IR consulted for thoracentesis, right side only -A.m. team to consult IR for an additional thoracentesis on the other side  # COVID-19 infection - IV remdesivir per pharmacy, IV solumedrol 1 g/kg IV q12h initiated  - Procalcitonin is negative, low clinical suspicion for superimposed bacterial infection at this time - Incentive spirometry and flutter valve for 10 reps every 2 hours while awake - Albuterol inhaler 2 puffs every 4 hours while awake - A.m. labs: CMP, CBC, ferritin - Supplemental oxygen to maintain SPO2 goal of greater than 88% - Airborne and contact precautions  # NSTEMI-I suspect this is multifactorial as well including bilateral pleural effusion causing demand ischemia and COVID infection - Heparin GTT initiated - Nitroglycerin ointment every 6 hours as needed for chest pain, 3 doses ordered - Comparing patient's echocardiogram from January 2020 and April 2022, there is a marked decrease in heart function, and the left heart cath in 2019 showed the proximal circumflex lesion has 40% stenosis with no intervention done at that time, a.m. team to consider cardiology consult for further evaluation - Patient denies chest pain or chest pressure which is reassuring - We will continue to trend high sensitive troponin - Admit to progressive cardiac, observation, with telemetry  # Heart failure reduced ejection fraction, - Strict I's and O's - Last echo was done in April/2022, see below - Lasix 40 mg IV once per ED, I ordered an additional 40 mg dose  # Oral thrush-presumed secondary to inhaler use - Present on admission - Nystatin suspension 5 mL, 4 times daily ordered for 3 days - Counseled patient on switching out his mouth after inhaler use  # History of hypertension-patient takes metoprolol succinate 25 mg p.o. daily  # Hyperlipidemia-atorvastatin 40 mg nightly resumed  # History of gout-allopurinol 100 mg p.o. daily  Chart reviewed.   Echocardiogram on  11/23/2020: Patient had estimated ejection fraction of 25 to 30%.  Left ventricular has severely decreased function.  Left ventricle demonstrates global hypokinesis.  Left ventricular diastolic parameters are consistent with grade 2 diastolic dysfunction.  Echocardiogram in 08/25/2018: Showed severe concentric hypertrophy.  Systolic function was mildly to moderately reduced.  Estimated ejection fraction was in 40 to 45%.  Hypokinesis of the inferior myocardium.  Features are consistent with pseudo normal left ventricular filling pattern.  Concha mitten abnormal relaxation and increased filling pressure, grade 2 diastolic dysfunction.  Left heart cath on 07/08/2018-was documented as proximal circumflex lesion is 40% stenosis, no intervention was documented at that time- medical management was recommended and low-sodium diet  DVT prophylaxis: Heparin GTT Code Status: Full code Diet: Heart healthy, n.p.o. after midnight Family Communication: Updated daughter, Erie Noe  Disposition Plan: Pending clinical course Consults called: None at this time Admission status: Progressive cardiac, observation, telemetry ordered  Past Medical History:  Diagnosis Date   CAD (coronary artery disease)    cath in Nov 2019   CHF (congestive heart failure) (HCC)    Hyperlipidemia    Hypertension    Ischemic cardiomyopathy    EF 40%   Neuromuscular disorder (HCC)    Peripheral neuropathy    Past Surgical History:  Procedure Laterality Date   COLONOSCOPY WITH PROPOFOL N/A 02/06/2015   Procedure: COLONOSCOPY WITH PROPOFOL;  Surgeon: Wallace Cullens, MD;  Location: [facility] ENDOSCOPY;  Service: Gastroenterology;  Laterality: N/A;   LEFT HEART CATH AND CORONARY ANGIOGRAPHY Left 07/08/2018   Procedure: LEFT HEART CATH AND CORONARY ANGIOGRAPHY;  Surgeon: Dalia Heading, MD;  Location: [facility] INVASIVE CV LAB;  Service: Cardiovascular;  Laterality: Left;   thyroid nodule removal     Social History:  reports that he has quit  smoking. His smoking use included cigarettes. He has a 5.00 pack-year smoking history. He has never used smokeless tobacco. He reports that he does not currently use alcohol. No history on file for drug use.  Allergies  Allergen Reactions   Benicar Hct [Olmesartan Medoxomil-Hctz] Itching   Lisinopril Cough   Family History  Problem Relation Age of Onset   Kidney disease Mother    Emphysema Father    Lung cancer Father    Family history: Family history reviewed and not pertinent  Prior to Admission medications   Medication Sig Start Date End Date Taking? Authorizing Provider  allopurinol (ZYLOPRIM) 100 MG tablet Take 100 mg by mouth daily as needed (gout prevention).     [provider]  aspirin 81 MG chewable tablet Chew 1 tablet (81 mg total) by mouth daily. 11/26/20   Zannie Cove, MD  atorvastatin (LIPITOR) 20 MG tablet Take 2 tablets (40 mg total) by mouth daily. 11/26/20   Zannie Cove, MD  furosemide (LASIX) 40 MG tablet Take 1 tablet (40 mg total) by mouth daily. 11/25/20 12/25/20  Zannie Cove, MD  metoprolol succinate (TOPROL-XL) 25 MG 24 hr tablet Take 2 tablets (50 mg total) by mouth daily. 11/25/20   Zannie Cove, MD  potassium chloride 20 MEQ TBCR Take 20 mEq by mouth daily. 11/25/20   Zannie Cove, MD  TRELEGY ELLIPTA 100-62.5-25 MCG/INH AEPB Inhale 1 puff into the lungs daily. 10/10/20   [provider]   Physical Exam: Vitals:   04/10/21 1452 04/10/21 1801 04/10/21 1930  BP: 122/82 117/88 115/70  Pulse: 93 79 70  Resp: 18 16 16   Temp: 97.8 F (36.6 C) 97.7 F (36.5 C)   TempSrc: Oral Oral   SpO2: 97% 99% 98%  Weight: 68.9 kg    Height: 6\' 1"  (1.854 m)     Constitutional: appears younger than chronological age, frail, NAD, calm, comfortable Eyes: PERRL, lids and conjunctivae normal ENMT: Mucous membranes are moist. Posterior pharynx clear of any exudate or lesions. Age-appropriate dentition. Hearing appropriate Neck: normal, supple, no  masses, no thyromegaly Respiratory: clear to auscultation bilaterally, no wheezing.  Bilateral lower lobe crackles, normal respiratory effort. No accessory muscle use.  Cardiovascular: Regular rate and rhythm, no murmurs / rubs / gallops. 2+ bilateral pitting lower extremity edema. 2+ pedal pulses. No carotid bruits.  Abdomen: no tenderness, no masses palpated, no hepatosplenomegaly. Bowel sounds positive.  Musculoskeletal: no clubbing / cyanosis. No joint deformity upper and lower extremities. Good ROM, no contractures, no atrophy. Normal muscle tone.  Skin: no rashes, lesions, ulcers. No induration Neurologic: Sensation intact. Strength 5/5 in all 4.  Psychiatric: Normal judgment and insight. Alert and oriented x 3. Normal mood.   EKG: independently reviewed, showing sinus rhythm, first-degree heart block, rate of 95, right bundle branch block is incomplete, QTc 472  Chest x-ray on Admission: I personally reviewed and I agree with radiologist reading as below.  DG Chest 2 View  Result Date: 04/10/2021 CLINICAL DATA:  Reason for exam: sob Pt reports sob and cough for a couple of days, denies any cp.  Reports hx of CHF and a lung disorder but unsure of the name. Hx of htn, ischemic cardiomyopathy, CHF, CAD EXAM: CHEST - 2 VIEW COMPARISON:  11/23/2020 FINDINGS: Moderate bilateral pleural effusions. Some improvement in the interstitial edema seen previously. Patchy atelectasis/consolidation in the lung bases as before. Size difficult to assess due to adjacent opacities. Aortic Atherosclerosis (ICD10-170.0). No pneumothorax. Visualized bones unremarkable. IMPRESSION: 1. Interval improvement in interstitial edema. 2. Persistent bilateral pleural effusions with bibasilar consolidation/atelectasis Electronically Signed   By: Corlis Leak M.D.   On: 04/10/2021 15:54    Labs on Admission: I have personally reviewed following labs  CBC: Recent Labs  Lab 04/10/21 1500  WBC 5.3  HGB 13.2  HCT 38.7*  MCV  95.3  PLT 211   Basic Metabolic Panel: Recent Labs  Lab 04/10/21 1500  NA 137  K 4.0  CL 97*  CO2 32  GLUCOSE 114*  BUN 28*  CREATININE 1.09  CALCIUM 8.8*   GFR: Estimated Creatinine Clearance: 52.7 mL/min (by C-G formula based on SCr of 1.09 mg/dL).  CRITICAL CARE Performed by: Lovenia Kim  Total critical care time: 35 minutes  Critical care time was exclusive of separately billable procedures and treating other patients.  Critical care was necessary to treat or prevent imminent or life-threatening deterioration.  Circulatory failure.  Respiratory distress.  Critical care was time spent personally by me on the following activities: development of treatment plan with patient and/or surrogate as well as nursing, discussions with consultants, evaluation of patient's response to treatment, examination of patient, obtaining history from patient or surrogate, ordering and performing treatments and interventions, ordering and review of laboratory studies, ordering and review of radiographic studies, pulse oximetry and re-evaluation of patient's condition.  Dr. Sedalia Muta [facility]  If 7PM-7AM, please contact overnight-coverage provider If 7AM-7PM, please contact day coverage provider [email or web address]  04/10/2021, 8:34 PM

## 2021-04-10 NOTE — Progress Notes (Signed)
ANTICOAGULATION CONSULT NOTE  Pharmacy Consult for heparin infusion Indication: NSTEMI  Allergies  Allergen Reactions   Benicar Hct [Olmesartan Medoxomil-Hctz] Itching   Lisinopril Cough    Patient Measurements: Height: 6\' 1"  (185.4 cm) Weight: 68.9 kg (152 lb) IBW/kg (Calculated) : 79.9 Heparin Dosing Weight: 68.9 kg  Vital Signs: Temp: 97.7 F (36.5 C) (08/30 1801) Temp Source: Oral (08/30 1801) BP: 115/70 (08/30 1930) Pulse Rate: 70 (08/30 1930)  Labs: Recent Labs    04/10/21 1500 04/10/21 1924  HGB 13.2  --   HCT 38.7*  --   PLT 211  --   CREATININE 1.09  --   TROPONINIHS 310* 318*    Estimated Creatinine Clearance: 52.7 mL/min (by C-G formula based on SCr of 1.09 mg/dL).   Medical History: Past Medical History:  Diagnosis Date   CAD (coronary artery disease)    cath in Nov 2019   CHF (congestive heart failure) (HCC)    Hyperlipidemia    Hypertension    Ischemic cardiomyopathy    EF 40%   Neuromuscular disorder (HCC)    Peripheral neuropathy     Medications:  Per chart review, pt not on any anticoagulation prior to admission.   Assessment: 80 yo male who presented to ED with SOB and cough. Pharmacy has been consulted for heparin dosing and monitoring. HS trop 318; BNP 1127.   Baseline labs: Hgb 13.2, Hct 38.7, Plt 211; aPTT and PT INR pending   Goal of Therapy:  Heparin level 0.3-0.7 units/ml Monitor platelets by anticoagulation protocol: Yes   Plan:  Give 4000 units bolus x 1 Start heparin infusion at 800 units/hr Check anti-Xa level in 8 hours and daily while on heparin Continue to monitor H&H and platelets  Teresea Donley O Temitope Griffing 04/10/2021,8:37 PM

## 2021-04-10 NOTE — ED Provider Notes (Signed)
[facility] Emergency Department Provider Note  ____________________________________________   Event Date/Time   First MD Initiated Contact with Patient 04/10/21 1819     (approximate)  I have reviewed the triage vital signs and the nursing notes.   HISTORY  Chief Complaint Shortness of Breath and Cough    HPI Todd Perez is a 80 y.o. 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  Patient reports some chronic issues with this but states that the past few days he developed more shortness of breath and coughing that is intermittent, worse with laying flat and worse with exertion, better at rest.  He states that he does have some swelling in his legs.  He reports the left leg is slightly larger than the right leg but denies any calf tenderness and states it is always like this.  Denies a history of blood clots.  Patient's daughter is at bedside.  Denies any falls.  On review of records patient had a recent catheterization that showed a chronic occlusion but no stents were placed and patient was managed medically.       Past Medical History:  Diagnosis Date   CAD (coronary artery disease)    cath in Nov 2019   CHF (congestive heart failure) (HCC)    Hyperlipidemia    Hypertension    Ischemic cardiomyopathy    EF 40%   Neuromuscular disorder [facility])    Peripheral neuropathy     Patient Active Problem List   Diagnosis Date Noted   Acute on chronic systolic CHF (congestive heart failure) (HCC) 11/23/2020   CAD (coronary artery disease)    Ischemic cardiomyopathy    Hypertension    Elevated troponin    Pneumonia 08/25/2018   CHF (congestive heart failure) (HCC) 04/18/2018    Past Surgical History:  Procedure Laterality Date   COLONOSCOPY WITH PROPOFOL N/A 02/06/2015   Procedure: COLONOSCOPY WITH PROPOFOL;  Surgeon: Wallace Cullens, MD;  Location: [facility]  ENDOSCOPY;  Service: Gastroenterology;  Laterality: N/A;   LEFT HEART CATH AND CORONARY ANGIOGRAPHY Left 07/08/2018   Procedure: LEFT HEART CATH AND CORONARY ANGIOGRAPHY;  Surgeon: Dalia Heading, MD;  Location: [facility] INVASIVE CV LAB;  Service: Cardiovascular;  Laterality: Left;   thyroid nodule removal      Prior to Admission medications   Medication Sig Start Date End Date Taking? Authorizing Provider  allopurinol (ZYLOPRIM) 100 MG tablet Take 100 mg by mouth daily as needed (gout prevention).     [provider]  aspirin 81 MG chewable tablet Chew 1 tablet (81 mg total) by mouth daily. 11/26/20   Zannie Cove, MD  atorvastatin (LIPITOR) 20 MG tablet Take 2 tablets (40 mg total) by mouth daily. 11/26/20   Zannie Cove, MD  furosemide (LASIX) 40 MG tablet Take 1 tablet (40 mg total) by mouth daily. 11/25/20 12/25/20  Zannie Cove, MD  metoprolol succinate (TOPROL-XL) 25 MG 24 hr tablet Take 2 tablets (50 mg total) by mouth daily. 11/25/20   Zannie Cove, MD  potassium chloride 20 MEQ TBCR Take 20 mEq by mouth daily. 11/25/20   Zannie Cove, MD  TRELEGY ELLIPTA 100-62.5-25 MCG/INH AEPB Inhale 1 puff into the lungs daily. 10/10/20   [provider]    Allergies Benicar hct [olmesartan medoxomil-hctz] and Lisinopril  Family History  Problem Relation Age of Onset   Kidney disease Mother    Emphysema Father  Lung cancer Father     Social History Social History   Tobacco Use   Smoking status: Former    Packs/day: 0.25    Years: 20.00    Pack years: 5.00    Types: Cigarettes   Smokeless tobacco: Never  Vaping Use   Vaping Use: Never used  Substance Use Topics   Alcohol use: Not Currently      Review of Systems Constitutional: No fever/chills Eyes: No visual changes. ENT: No sore throat. Cardiovascular: No chest pain Respiratory: Positive for SOB, cough Gastrointestinal: No abdominal pain.  No nausea, no vomiting.  No diarrhea.  No  constipation. Genitourinary: Negative for dysuria. Musculoskeletal: Negative for back pain.  Leg swelling Skin: Negative for rash. Neurological: Negative for headaches, focal weakness or numbness. All other ROS negative ____________________________________________   PHYSICAL EXAM:  VITAL SIGNS: ED Triage Vitals [04/10/21 1452]  Enc Vitals Group     BP 122/82     Pulse Rate 93     Resp 18     Temp 97.8 F (36.6 C)     Temp Source Oral     SpO2 97 %     Weight 152 lb (68.9 kg)     Height 6\' 1"  (1.854 m)     Head Circumference      Peak Flow      Pain Score 0     Pain Loc      Pain Edu?      Excl. in GC?     Constitutional: Alert and oriented. Well appearing and in no acute distress. Eyes: Conjunctivae are normal. EOMI. Head: Atraumatic. Nose: No congestion/rhinnorhea. Mouth/Throat: Mucous membranes are moist.   Neck: No stridor. Trachea Midline. FROM Cardiovascular: Normal rate, regular rhythm. Grossly normal heart sounds.  Good peripheral circulation. Respiratory: Clear lungs, no increased work of breathing Gastrointestinal: Soft and nontender. No distention. No abdominal bruits.  Musculoskeletal: 1+ edema bilaterally.  Slightly enlarged on the left, but no calf tenderness. Neurologic:  Normal speech and language. No gross focal neurologic deficits are appreciated.  Skin:  Skin is warm, dry and intact. No rash noted. Psychiatric: Mood and affect are normal. Speech and behavior are normal. GU: Deferred   ____________________________________________   LABS (all labs ordered are listed, but only abnormal results are displayed)  Labs Reviewed  BASIC METABOLIC PANEL - Abnormal; Notable for the following components:      Result Value   Chloride 97 (*)    Glucose, Bld 114 (*)    BUN 28 (*)    Calcium 8.8 (*)    All other components within normal limits  CBC - Abnormal; Notable for the following components:   RBC 4.06 (*)    HCT 38.7 (*)    All other components  within normal limits  BRAIN NATRIURETIC PEPTIDE - Abnormal; Notable for the following components:   B Natriuretic Peptide 1,127.9 (*)    All other components within normal limits   ____________________________________________   ED ECG REPORT I, Concha Se, the attending physician, personally viewed and interpreted this ECG.  Sinus rhythm 95, no ST elevations, T wave inversions in 1 aVL V2, incomplete right bundle branch block.  Similar T wave version in aVL EKG ____________________________________________  RADIOLOGY Vela Prose, personally viewed and evaluated these images (plain radiographs) as part of my medical decision making, as well as reviewing the written report by the radiologist.  ED MD interpretation: Patient has some interstitial edema with some moderate pleural effusions  Official radiology report(s): DG Chest 2 View  Result Date: 04/10/2021 CLINICAL DATA:  Reason for exam: sob Pt reports sob and cough for a couple of days, denies any cp. Reports hx of CHF and a lung disorder but unsure of the name. Hx of htn, ischemic cardiomyopathy, CHF, CAD EXAM: CHEST - 2 VIEW COMPARISON:  11/23/2020 FINDINGS: Moderate bilateral pleural effusions. Some improvement in the interstitial edema seen previously. Patchy atelectasis/consolidation in the lung bases as before. Size difficult to assess due to adjacent opacities. Aortic Atherosclerosis (ICD10-170.0). No pneumothorax. Visualized bones unremarkable. IMPRESSION: 1. Interval improvement in interstitial edema. 2. Persistent bilateral pleural effusions with bibasilar consolidation/atelectasis Electronically Signed   By: Corlis Leak M.D.   On: 04/10/2021 15:54    ____________________________________________   PROCEDURES  Procedure(s) performed (including Critical Care):  Procedures   ____________________________________________   INITIAL IMPRESSION / ASSESSMENT AND PLAN / ED COURSE   Todd Perez was evaluated in  Emergency Department on 04/10/2021 for the symptoms described in the history of present illness. He was evaluated in the context of the global COVID-19 pandemic, which necessitated consideration that the patient might be at risk for infection with the SARS-CoV-2 virus that causes COVID-19. Institutional protocols and algorithms that pertain to the evaluation of patients at risk for COVID-19 are in a state of rapid change based on information released by regulatory bodies including the CDC and federal and state organizations. These policies and algorithms were followed during the patient's care in the ED.     Pt presents with SOB. Differential includes: PNA-will get xray to evaluation Anemia-CBC to evaluate ACS- will get trops Arrhythmia-Will get EKG and keep on monitor.  COVID- will get testing per algorithm. PE-lower suspicion given no risk factors and other cause more likely.  Patient's left leg is slightly enlarged over the right but no calf tenderness and he states that it has been like this for years.  Discussed concern for potential clot but he is adamant that is not unchanged from his normal and that he does not want any ultrasounds done today.  Patient's chest x-ray shows some pulmonary edema and he is got some effusions noted.  We will give a dose of IV Lasix.  Patient is likely not hypoxic.  I suspect that patient will need continued diuresis although with careful watching of his blood pressures.  I did add on some troponins to make sure no evidence of NSTEMI today.  I discussed with him admission to get thoracentesis is done given he is needed this prior as well as to IV diurese him versus going home and following up with his pulmonologist and the heart failure clinic.  Patient is adamant that he would really like to go home.   Patient troponin is elevated but stable therefore will not start him on heparin given no chest pain but he is also COVID-positive.  Discussed with patient that he has  to many things going on today and I would recommend admission.  Patient is willing to stay in the hospital for further work-up and evaluation     ____________________________________________   FINAL CLINICAL IMPRESSION(S) / ED DIAGNOSES   Final diagnoses:  COVID-19  Pleural effusion  Acute on chronic heart failure, unspecified heart failure type (HCC)     MEDICATIONS GIVEN DURING THIS VISIT:  Medications  allopurinol (ZYLOPRIM) tablet 100 mg (has no administration in time range)  atorvastatin (LIPITOR) tablet 40 mg (has no administration in time range)  acetaminophen (TYLENOL) tablet 1,000 mg (has  no administration in time range)    Or  acetaminophen (TYLENOL) suppository 650 mg (has no administration in time range)  ondansetron (ZOFRAN) tablet 4 mg (has no administration in time range)    Or  ondansetron (ZOFRAN) injection 4 mg (has no administration in time range)  remdesivir 200 mg in sodium chloride 0.9% 250 mL IVPB (has no administration in time range)    Followed by  remdesivir 100 mg in sodium chloride 0.9 % 100 mL IVPB (has no administration in time range)  guaiFENesin-dextromethorphan (ROBITUSSIN DM) 100-10 MG/5ML syrup 10 mL (has no administration in time range)  heparin bolus via infusion 4,000 Units (has no administration in time range)  heparin ADULT infusion 100 units/mL (25000 units/254mL) (has no administration in time range)  furosemide (LASIX) injection 40 mg (40 mg Intravenous Given 04/10/21 1929)     ED Discharge Orders     None        Note:  This document was prepared using Dragon voice recognition software and may include unintentional dictation errors.   Concha Se, MD 04/10/21 [phone]

## 2021-04-10 NOTE — ED Notes (Signed)
MD Fuller Plan notified that patient is COVID POSITIVE

## 2021-04-11 ENCOUNTER — Observation Stay: Payer: Medicare Other

## 2021-04-11 ENCOUNTER — Inpatient Hospital Stay: Payer: Medicare Other

## 2021-04-11 DIAGNOSIS — Z841 Family history of disorders of kidney and ureter: Secondary | ICD-10-CM | POA: Diagnosis not present

## 2021-04-11 DIAGNOSIS — Z888 Allergy status to other drugs, medicaments and biological substances status: Secondary | ICD-10-CM | POA: Diagnosis not present

## 2021-04-11 DIAGNOSIS — Z6820 Body mass index (BMI) 20.0-20.9, adult: Secondary | ICD-10-CM | POA: Diagnosis not present

## 2021-04-11 DIAGNOSIS — I248 Other forms of acute ischemic heart disease: Secondary | ICD-10-CM | POA: Diagnosis present

## 2021-04-11 DIAGNOSIS — Z801 Family history of malignant neoplasm of trachea, bronchus and lung: Secondary | ICD-10-CM | POA: Diagnosis not present

## 2021-04-11 DIAGNOSIS — I5023 Acute on chronic systolic (congestive) heart failure: Secondary | ICD-10-CM | POA: Diagnosis present

## 2021-04-11 DIAGNOSIS — B370 Candidal stomatitis: Secondary | ICD-10-CM | POA: Diagnosis present

## 2021-04-11 DIAGNOSIS — Z79899 Other long term (current) drug therapy: Secondary | ICD-10-CM | POA: Diagnosis not present

## 2021-04-11 DIAGNOSIS — Z825 Family history of asthma and other chronic lower respiratory diseases: Secondary | ICD-10-CM | POA: Diagnosis not present

## 2021-04-11 DIAGNOSIS — I110 Hypertensive heart disease with heart failure: Secondary | ICD-10-CM | POA: Diagnosis present

## 2021-04-11 DIAGNOSIS — Y848 Other medical procedures as the cause of abnormal reaction of the patient, or of later complication, without mention of misadventure at the time of the procedure: Secondary | ICD-10-CM | POA: Diagnosis not present

## 2021-04-11 DIAGNOSIS — J95811 Postprocedural pneumothorax: Secondary | ICD-10-CM | POA: Diagnosis not present

## 2021-04-11 DIAGNOSIS — J90 Pleural effusion, not elsewhere classified: Secondary | ICD-10-CM | POA: Diagnosis present

## 2021-04-11 DIAGNOSIS — E785 Hyperlipidemia, unspecified: Secondary | ICD-10-CM | POA: Diagnosis present

## 2021-04-11 DIAGNOSIS — R0602 Shortness of breath: Secondary | ICD-10-CM | POA: Diagnosis not present

## 2021-04-11 DIAGNOSIS — Z87891 Personal history of nicotine dependence: Secondary | ICD-10-CM | POA: Diagnosis not present

## 2021-04-11 DIAGNOSIS — J918 Pleural effusion in other conditions classified elsewhere: Secondary | ICD-10-CM | POA: Diagnosis present

## 2021-04-11 DIAGNOSIS — I2510 Atherosclerotic heart disease of native coronary artery without angina pectoris: Secondary | ICD-10-CM | POA: Diagnosis present

## 2021-04-11 DIAGNOSIS — M109 Gout, unspecified: Secondary | ICD-10-CM | POA: Diagnosis present

## 2021-04-11 DIAGNOSIS — I4510 Unspecified right bundle-branch block: Secondary | ICD-10-CM | POA: Diagnosis present

## 2021-04-11 DIAGNOSIS — J440 Chronic obstructive pulmonary disease with acute lower respiratory infection: Secondary | ICD-10-CM | POA: Diagnosis present

## 2021-04-11 DIAGNOSIS — U071 COVID-19: Secondary | ICD-10-CM | POA: Diagnosis present

## 2021-04-11 DIAGNOSIS — I081 Rheumatic disorders of both mitral and tricuspid valves: Secondary | ICD-10-CM | POA: Diagnosis present

## 2021-04-11 DIAGNOSIS — Z7982 Long term (current) use of aspirin: Secondary | ICD-10-CM | POA: Diagnosis not present

## 2021-04-11 DIAGNOSIS — I440 Atrioventricular block, first degree: Secondary | ICD-10-CM | POA: Diagnosis present

## 2021-04-11 DIAGNOSIS — I420 Dilated cardiomyopathy: Secondary | ICD-10-CM | POA: Diagnosis present

## 2021-04-11 DIAGNOSIS — E43 Unspecified severe protein-calorie malnutrition: Secondary | ICD-10-CM | POA: Diagnosis present

## 2021-04-11 LAB — COMPREHENSIVE METABOLIC PANEL
ALT: 62 U/L — ABNORMAL HIGH (ref 0–44)
AST: 77 U/L — ABNORMAL HIGH (ref 15–41)
Albumin: 3.3 g/dL — ABNORMAL LOW (ref 3.5–5.0)
Alkaline Phosphatase: 129 U/L — ABNORMAL HIGH (ref 38–126)
Anion gap: 8 (ref 5–15)
BUN: 28 mg/dL — ABNORMAL HIGH (ref 8–23)
CO2: 34 mmol/L — ABNORMAL HIGH (ref 22–32)
Calcium: 8.5 mg/dL — ABNORMAL LOW (ref 8.9–10.3)
Chloride: 96 mmol/L — ABNORMAL LOW (ref 98–111)
Creatinine, Ser: 0.99 mg/dL (ref 0.61–1.24)
GFR, Estimated: >60 mL/min (ref >60)
Glucose, Bld: 98 mg/dL (ref 70–99)
Potassium: 3.8 mmol/L (ref 3.5–5.1)
Sodium: 138 mmol/L (ref 135–145)
Total Bilirubin: 0.8 mg/dL (ref 0.3–1.2)
Total Protein: 7.3 g/dL (ref 6.5–8.1)

## 2021-04-11 LAB — LACTATE DEHYDROGENASE, PLEURAL OR PERITONEAL FLUID: LD, Fluid: 51 U/L — ABNORMAL HIGH (ref 3–23)

## 2021-04-11 LAB — CBC
HCT: 39.7 % (ref 39.0–52.0)
Hemoglobin: 13.6 g/dL (ref 13.0–17.0)
MCH: 32 pg (ref 26.0–34.0)
MCHC: 34.3 g/dL (ref 30.0–36.0)
MCV: 93.4 fL (ref 80.0–100.0)
Platelets: 209 10*3/uL (ref 150–400)
RBC: 4.25 MIL/uL (ref 4.22–5.81)
RDW: 14.5 % (ref 11.5–15.5)
WBC: 5.2 10*3/uL (ref 4.0–10.5)
nRBC: 0 % (ref 0.0–0.2)

## 2021-04-11 LAB — PROTEIN, PLEURAL OR PERITONEAL FLUID: Total protein, fluid: <3 g/dL

## 2021-04-11 LAB — BODY FLUID CELL COUNT WITH DIFFERENTIAL
Eos, Fluid: 0 %
Lymphs, Fluid: 80 %
Monocyte-Macrophage-Serous Fluid: 9 %
Neutrophil Count, Fluid: 11 %
Total Nucleated Cell Count, Fluid: 741 cu mm

## 2021-04-11 LAB — HEPARIN LEVEL (UNFRACTIONATED): Heparin Unfractionated: 0.34 IU/mL (ref 0.30–0.70)

## 2021-04-11 LAB — PATHOLOGIST SMEAR REVIEW

## 2021-04-11 LAB — PROCALCITONIN: Procalcitonin: <0.1 ng/mL

## 2021-04-11 LAB — TROPONIN I (HIGH SENSITIVITY): Troponin I (High Sensitivity): 336 ng/L (ref <18)

## 2021-04-11 LAB — FERRITIN: Ferritin: 184 ng/mL (ref 24–336)

## 2021-04-11 MED ORDER — NIRMATRELVIR/RITONAVIR (PAXLOVID)TABLET
3.0000 | ORAL_TABLET | Freq: Two times a day (BID) | ORAL | Status: DC
  Administered 2021-04-12 – 2021-04-14 (×4): 3 via ORAL
  Filled 2021-04-11: qty 30

## 2021-04-11 MED ORDER — DEXAMETHASONE SODIUM PHOSPHATE 10 MG/ML IJ SOLN
6.0000 mg | Freq: Every day | INTRAMUSCULAR | Status: DC
  Administered 2021-04-11 – 2021-04-13 (×3): 6 mg via INTRAVENOUS
  Filled 2021-04-11 (×3): qty 1

## 2021-04-11 MED ORDER — ATORVASTATIN CALCIUM 20 MG PO TABS: 40.0000 mg | ORAL_TABLET | Freq: Every day | ORAL | Status: DC

## 2021-04-11 MED ORDER — NYSTATIN 100000 UNIT/ML MT SUSP
5.0000 mL | Freq: Four times a day (QID) | OROMUCOSAL | Status: AC
  Administered 2021-04-11 – 2021-04-13 (×8): 500000 [IU] via OROMUCOSAL
  Filled 2021-04-11 (×13): qty 5

## 2021-04-11 MED ORDER — UMECLIDINIUM BROMIDE 62.5 MCG/INH IN AEPB
1.0000 | INHALATION_SPRAY | Freq: Every evening | RESPIRATORY_TRACT | Status: DC
  Administered 2021-04-11 – 2021-04-13 (×3): 1 via RESPIRATORY_TRACT
  Filled 2021-04-11: qty 7

## 2021-04-11 MED ORDER — FLUTICASONE FUROATE-VILANTEROL 200-25 MCG/INH IN AEPB
1.0000 | INHALATION_SPRAY | Freq: Every evening | RESPIRATORY_TRACT | Status: DC
  Administered 2021-04-11 – 2021-04-13 (×3): 1 via RESPIRATORY_TRACT
  Filled 2021-04-11: qty 28

## 2021-04-11 MED ORDER — FUROSEMIDE 10 MG/ML IJ SOLN
40.0000 mg | Freq: Two times a day (BID) | INTRAMUSCULAR | Status: DC
  Administered 2021-04-11 – 2021-04-12 (×3): 40 mg via INTRAVENOUS
  Filled 2021-04-11 (×3): qty 4

## 2021-04-11 NOTE — ED Notes (Signed)
Pt was up freshening up with water. Gave pt a toothbrush and toothpaste.

## 2021-04-11 NOTE — ED Notes (Signed)
Lunch tray provided. 

## 2021-04-11 NOTE — ED Notes (Signed)
Rn to bedside to introduce self to pt. Pt CAOx4. Pt IV pump in place and running.

## 2021-04-11 NOTE — Consult Note (Addendum)
[facility] Cardiology  CARDIOLOGY CONSULT NOTE  Patient ID: Todd Perez MRN: [id number] DOB/AGE: 1941-06-06 80 y.o.  Admit date: 04/10/2021 Referring Physician [place] Primary Physician [facility] Primary Cardiologist Fath Reason for Consultation elevated troponin  HPI: 80 year old gentleman referred for evaluation of elevated troponin.  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  Patient presents with chief complaint of shortness of breath, orthopnea, and fluid retention.  X-ray reveals bilateral pleural effusions.  The patient tested positive for COVID-19.  Admission labs were notable for mildly elevated troponin in the absence of chest pain (310, 318, 284, 336) without delta.  The patient has undergone recent cardiac catheterization 07/10/2018 which revealed minor coronary artery disease with occluded RV marginal branch with LVEF 35 to 40%.  Review of systems complete and found to be negative unless listed above     Past Medical History:  Diagnosis Date   CAD (coronary artery disease)    cath in Nov 2019   CHF (congestive heart failure) (HCC)    Hyperlipidemia    Hypertension    Ischemic cardiomyopathy    EF 40%   Neuromuscular disorder (HCC)    Peripheral neuropathy     Past Surgical History:  Procedure Laterality Date   COLONOSCOPY WITH PROPOFOL N/A 02/06/2015   Procedure: COLONOSCOPY WITH PROPOFOL;  Surgeon: Wallace Cullens, MD;  Location: [facility] ENDOSCOPY;  Service: Gastroenterology;  Laterality: N/A;   LEFT HEART CATH AND CORONARY ANGIOGRAPHY Left 07/08/2018   Procedure: LEFT HEART CATH AND CORONARY ANGIOGRAPHY;  Surgeon: Dalia Heading, MD;  Location: [facility] INVASIVE CV LAB;  Service: Cardiovascular;  Laterality: Left;   thyroid nodule removal      (Not in a hospital admission)  Social History   Socioeconomic History   Marital status: Married    Spouse name: Not  on file   Number of children: Not on file   Years of education: Not on file   Highest education level: Not on file  Occupational History   Not on file  Tobacco Use   Smoking status: Former    Packs/day: 0.25    Years: 20.00    Pack years: 5.00    Types: Cigarettes   Smokeless tobacco: Never  Vaping Use   Vaping Use: Never used  Substance and Sexual Activity   Alcohol use: Not Currently   Drug use: Not on file   Sexual activity: Not on file  Other Topics Concern   Not on file  Social History Narrative   Independent at baseline.  Lives at home with his wife.  Works as a Production designer, theatre/television/film person in an apartment complex in [employer]   Social Determinants of Corporate investment banker Strain: Not on [employer] Insecurity: Not on file  Transportation Needs: Not on file  Physical Activity: Not on file  Stress: Not on file  Social Connections: Not on file  Intimate Partner Violence: Not on file    Family History  Problem Relation Age of Onset   Kidney disease Mother    Emphysema Father    Lung cancer Father       Review of systems complete and found to be negative unless listed above      PHYSICAL EXAM  General: Well developed, well nourished, in no acute distress HEENT:  Normocephalic and atramatic Neck:  No JVD.  Lungs: Clear bilaterally to auscultation and percussion. Heart: HRRR . Normal S1 and S2 without  gallops or murmurs.  Abdomen: Bowel sounds are positive, abdomen soft and non-tender  Msk:  Back normal, normal gait. Normal strength and tone for age. Extremities: No clubbing, cyanosis or edema.   Neuro: Alert and oriented X 3. Psych:  Good affect, responds appropriately  Labs:   Lab Results  Component Value Date   WBC 5.2 04/11/2021   HGB 13.6 04/11/2021   HCT 39.7 04/11/2021   MCV 93.4 04/11/2021   PLT 209 04/11/2021    Recent Labs  Lab 04/11/21 0540  NA 138  K 3.8  CL 96*  CO2 34*  BUN 28*  CREATININE 0.99  CALCIUM 8.5*  PROT 7.3  BILITOT 0.8   ALKPHOS 129*  ALT 62*  AST 77*  GLUCOSE 98   Lab Results  Component Value Date   TROPONINI 0.22 (HH) 08/26/2018   No results found for: CHOL No results found for: HDL No results found for: LDLCALC No results found for: TRIG No results found for: CHOLHDL No results found for: LDLDIRECT    Radiology: DG Chest 2 View  Result Date: 04/10/2021 CLINICAL DATA:  Reason for exam: sob Pt reports sob and cough for a couple of days, denies any cp. Reports hx of CHF and a lung disorder but unsure of the name. Hx of htn, ischemic cardiomyopathy, CHF, CAD EXAM: CHEST - 2 VIEW COMPARISON:  11/23/2020 FINDINGS: Moderate bilateral pleural effusions. Some improvement in the interstitial edema seen previously. Patchy atelectasis/consolidation in the lung bases as before. Size difficult to assess due to adjacent opacities. Aortic Atherosclerosis (ICD10-170.0). No pneumothorax. Visualized bones unremarkable. IMPRESSION: 1. Interval improvement in interstitial edema. 2. Persistent bilateral pleural effusions with bibasilar consolidation/atelectasis Electronically Signed   By: Corlis Leak M.D.   On: 04/10/2021 15:54    EKG: Sinus rhythm at 95 bpm with incomplete right bundle branch block  ASSESSMENT AND PLAN:   1.  Elevated troponin (310, 318, 284, 336) in the absence of chest pain, in the setting of acute on chronic systolic congestive heart failure and COVID-19 pneumonia, very likely demand supply ischemia, not due to acute coronary syndrome. 2.  Coronary artery disease, mild, with cardiac catheterization 07/10/2018 revealing occluded RV marginal branch 3.  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  Acute on chronic systolic congestive heart failure, treated with IV furosemide 5.  Bilateral pleural effusions, awaiting thoracentesis 6.  COVID-19 pneumonia, treated with IV remdesivir and  Solu-Medrol  Recommendations  1.  Agree with current therapy 2.  Diuresis as needed 3.  Carefully monitor renal status 4.  DC heparin drip 5.  Proceed with right-sided thoracentesis  Signed: Marcina Millard MD,PhD, [facility] 04/11/2021, 8:56 AM

## 2021-04-11 NOTE — Procedures (Signed)
PROCEDURE SUMMARY:  Successful US guided right thoracentesis. Yielded 1 liter of clear yellow fluid. Pt tolerated procedure well. No immediate complications.  Specimen was sent for labs. CXR ordered.  EBL < 5 mL  Cloretta Ned 04/11/2021 10:53 AM

## 2021-04-11 NOTE — Progress Notes (Signed)
[facility] PROGRESS NOTE    Todd Perez  OEH:[id number] DOB: [birth year] DOA: 04/10/2021 PCP: [name], MD      Brief Narrative:  Todd Perez is a 80 y.o. M with COPD, CHF EF 25-30%, HTN who prsented with SOB, cough, leg swelling and orthpnea.  Patient started to develop symptoms about 1 week ago, this progressed to severe dyspnea with exertion and orthopnea so he came to the ER.  In the ER, chest x-ray showed bilateral opacities with effusions, COVID-positive, troponin minimally elevated and flat.  Started on Lasix and heparin infusion.          Assessment & Plan:  Acute on chronic systolic CHF Presented with dyspnea on exertion, orthopnea, leg swelling, bilateral infiltrates on chest x-ray and bilateral pleural effusions. - Continue Lasix - Daily BMP - Strict intake and output   Pleural effusion Likely CHF related. - Ultrasound-guided thoracentesis ordered  COVID Mild symptoms, high risk given symptoms. - STart Paxlovid - Stop remdesivir, low evidence for benefit - Will start steroids given infiltrates and equipoise if this is CHF or COVID infiltrates.  Given he is vaccinated, I suspect the former.  If infiltrates improve with diuresis, will stop decadron  COPD No evidence of flare - Continue ICS/LABA/LAMA  Hypertension Coronary disease - Continue metoprolol, atorvastatin        Disposition: Status is: Inpatient  Remains inpatient appropriate because:IV treatments appropriate due to intensity of illness or inability to take PO  Dispo: The patient is from: Home              Anticipated d/c is to: Home              Patient currently is not medically stable to d/c.   Difficult to place patient No       Level of care: Med-Surg       MDM: The below labs and imaging reports were reviewed and summarized above.  Medication management as above.     DVT prophylaxis: Place TED hose Start: 04/10/21 2030  Code Status:  FULL Family Communication: daughter at bedside          Subjective: Patient still has leg swelling, shortness of breath, no sore throat, runny nose.  Fever, myalgias.  Objective: Vitals:   04/11/21 1400 04/11/21 1430 04/11/21 1500 04/11/21 1530  BP: 111/80 110/85 111/84 113/90  Pulse: 78 75 73 77  Resp:    16  Temp:      TempSrc:      SpO2: 97% 96% 98% 95%  Weight:      Height:        Intake/Output Summary (Last 24 hours) at 04/11/2021 1706 Last data filed at 04/11/2021 1021 Gross per 24 hour  Intake 250 ml  Output 950 ml  Net -700 ml   Filed Weights   04/10/21 1452  Weight: 68.9 kg    Examination: General appearance:  adult male, alert and in no distress.   HEENT: Anicteric, conjunctiva pink, lids and lashes normal. No nasal deformity, discharge, epistaxis.  Lips moist.   Skin: Warm and dry.  no jaundice.  No suspicious rashes or lesions. Cardiac: RRR, nl S1-S2, no murmurs appreciated.  Capillary refill is brisk.  1+ lower extremity edema.  Respiratory: Normal respiratory rate and rhythm.  Lung sounds diminished at both bases, no wheezing  abdomen: Abdomen soft.  no TTP. No ascites, distension, hepatosplenomegaly.   MSK: No deformities or effusions. Neuro: Awake and alert.  EOMI, moves all  extremities. Speech fluent.    Psych: Sensorium intact and responding to questions, attention normal. Affect appropriate.  Judgment and insight appear normal.    Data Reviewed: I have personally reviewed following labs and imaging studies:  CBC: Recent Labs  Lab 04/10/21 1500 04/11/21 0540  WBC 5.3 5.2  HGB 13.2 13.6  HCT 38.7* 39.7  MCV 95.3 93.4  PLT 211 209   Basic Metabolic Panel: Recent Labs  Lab 04/10/21 1500 04/11/21 0540  NA 137 138  K 4.0 3.8  CL 97* 96*  CO2 32 34*  GLUCOSE 114* 98  BUN 28* 28*  CREATININE 1.09 0.99  CALCIUM 8.8* 8.5*   GFR: Estimated Creatinine Clearance: 58 mL/min (by C-G formula based on SCr of 0.99 mg/dL). Liver Function  Tests: Recent Labs  Lab 04/11/21 0540  AST 77*  ALT 62*  ALKPHOS 129*  BILITOT 0.8  PROT 7.3  ALBUMIN 3.3*   No results for input(s): LIPASE, AMYLASE in the last 168 hours. No results for input(s): AMMONIA in the last 168 hours. Coagulation Profile: Recent Labs  Lab 04/10/21 2256  INR 1.1   Cardiac Enzymes: No results for input(s): CKTOTAL, CKMB, CKMBINDEX, TROPONINI in the last 168 hours. BNP (last 3 results) No results for input(s): PROBNP in the last 8760 hours. HbA1C: No results for input(s): HGBA1C in the last 72 hours. CBG: No results for input(s): GLUCAP in the last 168 hours. Lipid Profile: No results for input(s): CHOL, HDL, LDLCALC, TRIG, CHOLHDL, LDLDIRECT in the last 72 hours. Thyroid Function Tests: No results for input(s): TSH, T4TOTAL, FREET4, T3FREE, THYROIDAB in the last 72 hours. Anemia Panel: Recent Labs    04/11/21 0540  FERRITIN 184   Urine analysis: No results found for: COLORURINE, APPEARANCEUR, LABSPEC, PHURINE, GLUCOSEU, HGBUR, BILIRUBINUR, KETONESUR, PROTEINUR, UROBILINOGEN, NITRITE, LEUKOCYTESUR Sepsis Labs: @LABRCNTIP (procalcitonin:4,lacticacidven:4)  ) Recent Results (from the past 240 hour(s))  Resp Panel by RT-PCR (Flu A&B, Covid) Nasopharyngeal Swab     Status: Abnormal   Collection Time: 04/10/21  7:14 PM   Specimen: Nasopharyngeal Swab; Nasopharyngeal(NP) swabs in vial transport medium  Result Value Ref Range Status   SARS Coronavirus 2 by RT PCR POSITIVE (A) NEGATIVE Final    Comment: RESULT CALLED TO, READ BACK BY AND VERIFIED WITH: tia bullock @2013  on 04/10/21 skl (NOTE) SARS-CoV-2 target nucleic acids are DETECTED.  The SARS-CoV-2 RNA is generally detectable in upper respiratory specimens during the acute phase of infection. Positive results are indicative of the presence of the identified virus, but do not rule out bacterial infection or co-infection with other pathogens not detected by the test. Clinical correlation  with patient history and other diagnostic information is necessary to determine patient infection status. The expected result is Negative.  Fact Sheet for Patients:  Fact Sheet for Healthcare Providers: 04/12/21  This test is not yet approved or cleared by the [employer] FDA and  has been authorized for detection and/or diagnosis of SARS-CoV-2 by FDA under an Emergency Use Authorization (EUA).  This EUA will remain in effect (meaning this test can be  used) for the duration of  the COVID-19 declaration under Section 564(b)(1) of the Act, 21 U.S.C. section 360bbb-3(b)(1), unless the authorization is terminated or revoked sooner.     Influenza A by PCR NEGATIVE NEGATIVE Final   Influenza B by PCR NEGATIVE NEGATIVE Final    Comment: (NOTE) The Xpert Xpress SARS-CoV-2/FLU/RSV plus assay is intended as an aid in the diagnosis of influenza from Nasopharyngeal swab specimens and  should not be used as a sole basis for treatment. Nasal washings and aspirates are unacceptable for Xpert Xpress SARS-CoV-2/FLU/RSV testing.  Fact Sheet for Patients: [employer]com  Fact Sheet for Healthcare Providers: [employer]ious[employer]ker[employer]it  This test is not yet approved or cleared by the [place] FDA and has been authorized for detection and/or diagnosis of SARS-CoV-2 by FDA under an Emergency Use Authorization (EUA). This EUA will remain in effect (meaning this test can be used) for the duration of the COVID-19 declaration under Section 564(b)(1) of the Act, 21 U.S.C. section 360bbb-3(b)(1), unless the authorization is terminated or revoked.  Performed at [facility], [address] [place], [place] [address]   Body fluid culture w Gram Stain     Status: None (Preliminary result)   Collection Time: 04/11/21 10:34 AM   Specimen: PATH Cytology Pleural fluid   Result Value Ref Range Status   Specimen Description   Final    PLEURAL Performed at [facility], [address] [place], [place] [address]    Special Requests   Final    NONE Performed at [facility] [address] [place], [place] [address]    Gram Stain   Final    RARE WBC PRESENT, PREDOMINANTLY MONONUCLEAR NO ORGANISMS SEEN Performed at [facility], [address], [place], [place] [address]    Culture PENDING  Incomplete   Report Status PENDING  Incomplete         Radiology Studies: DG Chest 2 View  Result Date: 04/10/2021 CLINICAL DATA:  Reason for exam: sob Pt reports sob and cough for a couple of days, denies any cp. Reports hx of CHF and a lung disorder but unsure of the name. Hx of htn, ischemic cardiomyopathy, CHF, CAD EXAM: CHEST - 2 VIEW COMPARISON:  11/23/2020 FINDINGS: Moderate bilateral pleural effusions. Some improvement in the interstitial edema seen previously. Patchy atelectasis/consolidation in the lung bases as before. Size difficult to assess due to adjacent opacities. Aortic Atherosclerosis (ICD10-170.0). No pneumothorax. Visualized bones unremarkable. IMPRESSION: 1. Interval improvement in interstitial edema. 2. Persistent bilateral pleural effusions with bibasilar consolidation/atelectasis Electronically Signed   By: Corlis Leak M.D.   On: 04/10/2021 15:54   DG Chest Port 1 View  Result Date: 04/11/2021 CLINICAL DATA:  Post thoracentesis EXAM: PORTABLE CHEST 1 VIEW COMPARISON:  Radiograph 04/10/2021 FINDINGS: Unchanged cardiomediastinal silhouette. Unchanged diffuse interstitial opacities. Persistent bilateral pleural effusions, decreased on the right in comparison to prior exam. There is no visible pneumothorax. No acute osseous abnormality. IMPRESSION: Persistent bilateral pleural effusions, decreased on the right in comparison prior exam. No visible pneumothorax. Electronically Signed   By: Caprice Renshaw M.D.   On: 04/11/2021  11:22   US THORACENTESIS ASP PLEURAL SPACE W/IMG GUIDE  Result Date: 04/11/2021 INDICATION: Bilateral pleural effusions, request received for diagnostic and therapeutic thoracentesis. EXAM: ULTRASOUND GUIDED RIGHT THORACENTESIS MEDICATIONS: 1% lidocaine only. COMPLICATIONS: None immediate. PROCEDURE: An ultrasound guided thoracentesis was thoroughly discussed with the patient and questions answered. The benefits, risks, alternatives and complications were also discussed. The patient understands and wishes to proceed with the procedure. Written consent was obtained. Ultrasound was performed to localize and mark an adequate pocket of fluid in the right chest. The area was then prepped and draped in the normal sterile fashion. 1% Lidocaine was used for local anesthesia. Under ultrasound guidance a 19 gauge, 7-cm, Yueh catheter was introduced. Thoracentesis was performed. The catheter was removed and a dressing applied. FINDINGS: A total of approximately 1000 mL  of clear yellow fluid was removed. Samples were sent to the laboratory as requested by the clinical team. IMPRESSION: Successful ultrasound guided right thoracentesis yielding 1 L of pleural fluid. Read By: Pattricia Boss PA-C Electronically Signed   By: Acquanetta Belling M.D.   On: 04/11/2021 10:53        Scheduled Meds:  atorvastatin  40 mg Oral Daily   dexamethasone (DECADRON) injection  6 mg Intravenous Daily   fluticasone furoate-vilanterol  1 puff Inhalation QPM   And   umeclidinium bromide  1 puff Inhalation QPM   furosemide  40 mg Intravenous BID   metoprolol succinate  25 mg Oral Daily   nystatin  5 mL Mouth/Throat QID   spironolactone  25 mg Oral Daily   Continuous Infusions:  remdesivir 100 mg in NS 100 mL Stopped (04/11/21 1406)     LOS: 0 days    Time spent: 35 minutes    Alberteen Sam, MD [facility] 04/11/2021, 5:06 PM     Please page though AMION or Epic secure chat:  For [employer], Web designer

## 2021-04-11 NOTE — Progress Notes (Signed)
ANTICOAGULATION CONSULT NOTE  Pharmacy Consult for heparin infusion Indication: NSTEMI  Allergies  Allergen Reactions   Benicar Hct [Olmesartan Medoxomil-Hctz] Itching   Lisinopril Cough    Patient Measurements: Height: 6\' 1"  (185.4 cm) Weight: 68.9 kg (152 lb) IBW/kg (Calculated) : 79.9 Heparin Dosing Weight: 68.9 kg  Vital Signs: BP: 111/81 (08/31 0430) Pulse Rate: 74 (08/31 0430)  Labs: Recent Labs    04/10/21 1500 04/10/21 1924 04/10/21 2256 04/11/21 0010 04/11/21 0540  HGB 13.2  --   --   --  13.6  HCT 38.7*  --   --   --  39.7  PLT 211  --   --   --  209  APTT  --   --  30  --   --   LABPROT  --   --  14.5  --   --   INR  --   --  1.1  --   --   HEPARINUNFRC  --   --   --   --  0.34  CREATININE 1.09  --   --   --   --   TROPONINIHS 310* 318* 284* 336*  --      Estimated Creatinine Clearance: 52.7 mL/min (by C-G formula based on SCr of 1.09 mg/dL).   Medical History: Past Medical History:  Diagnosis Date   CAD (coronary artery disease)    cath in Nov 2019   CHF (congestive heart failure) (HCC)    Hyperlipidemia    Hypertension    Ischemic cardiomyopathy    EF 40%   Neuromuscular disorder (HCC)    Peripheral neuropathy     Medications:  Per chart review, pt not on any anticoagulation prior to admission.   Assessment: 80 yo male who presented to ED with SOB and cough. Pharmacy has been consulted for heparin dosing and monitoring. HS trop 318; BNP 1127.   Baseline labs: Hgb 13.2, Hct 38.7, Plt 211; aPTT and PT INR pending   Goal of Therapy:  Heparin level 0.3-0.7 units/ml Monitor platelets by anticoagulation protocol: Yes   Plan:  8/31:  HL @ 0540 = 0.34 Will continue pt on current rate and draw confirmation level in 8 hrs on 8/31 @ 1400.   Louie Flenner D 04/11/2021,6:12 AM

## 2021-04-12 ENCOUNTER — Encounter: Payer: Self-pay | Admitting: Family Medicine

## 2021-04-12 DIAGNOSIS — E43 Unspecified severe protein-calorie malnutrition: Secondary | ICD-10-CM | POA: Insufficient documentation

## 2021-04-12 LAB — PROTEIN, BODY FLUID (OTHER): Total Protein, Body Fluid Other: 1.3 g/dL

## 2021-04-12 LAB — COMPREHENSIVE METABOLIC PANEL
ALT: 56 U/L — ABNORMAL HIGH (ref 0–44)
AST: 58 U/L — ABNORMAL HIGH (ref 15–41)
Albumin: 3.2 g/dL — ABNORMAL LOW (ref 3.5–5.0)
Alkaline Phosphatase: 128 U/L — ABNORMAL HIGH (ref 38–126)
Anion gap: 9 (ref 5–15)
BUN: 31 mg/dL — ABNORMAL HIGH (ref 8–23)
CO2: 31 mmol/L (ref 22–32)
Calcium: 8.9 mg/dL (ref 8.9–10.3)
Chloride: 97 mmol/L — ABNORMAL LOW (ref 98–111)
Creatinine, Ser: 1.08 mg/dL (ref 0.61–1.24)
GFR, Estimated: >60 mL/min (ref >60)
Glucose, Bld: 126 mg/dL — ABNORMAL HIGH (ref 70–99)
Potassium: 4.3 mmol/L (ref 3.5–5.1)
Sodium: 137 mmol/L (ref 135–145)
Total Bilirubin: 0.7 mg/dL (ref 0.3–1.2)
Total Protein: 7.3 g/dL (ref 6.5–8.1)

## 2021-04-12 LAB — FERRITIN: Ferritin: 178 ng/mL (ref 24–336)

## 2021-04-12 LAB — PROCALCITONIN: Procalcitonin: <0.1 ng/mL

## 2021-04-12 MED ORDER — ENOXAPARIN SODIUM 40 MG/0.4ML IJ SOSY
40.0000 mg | PREFILLED_SYRINGE | INTRAMUSCULAR | Status: DC
  Administered 2021-04-12: 40 mg via SUBCUTANEOUS
  Filled 2021-04-12 (×2): qty 0.4

## 2021-04-12 MED ORDER — ADULT MULTIVITAMIN W/MINERALS CH
1.0000 | ORAL_TABLET | Freq: Every day | ORAL | Status: DC
  Administered 2021-04-13 – 2021-04-14 (×2): 1 via ORAL
  Filled 2021-04-12 (×2): qty 1

## 2021-04-12 MED ORDER — ENSURE ENLIVE PO LIQD
237.0000 mL | Freq: Three times a day (TID) | ORAL | Status: DC
  Administered 2021-04-12 – 2021-04-14 (×3): 237 mL via ORAL

## 2021-04-12 NOTE — Progress Notes (Signed)
Chaplain Maggie delivered a bible to bedside per pt's request through his nurse. Continued support available per on call chaplain.

## 2021-04-12 NOTE — Progress Notes (Signed)
[facility] PROGRESS NOTE    Todd Perez  RCB:[id number] DOB: 1940/08/15 DOA: 04/10/2021 PCP: [name], MD      Brief Narrative:  Todd Perez is a 80 y.o. M with COPD, CHF EF 25-30%, HTN who prsented with SOB, cough, leg swelling and orthpnea.  Patient started to develop symptoms about 1 week ago, this progressed to severe dyspnea with exertion and orthopnea so he came to the ER.  In the ER, chest x-ray showed bilateral opacities with effusions, COVID-positive, troponin minimally elevated and flat.  Started on Lasix and heparin infusion.          Assessment & Plan:  Acute on chronic systolic CHF Presented with dyspnea on exertion, orthopnea, leg swelling, bilateral infiltrates on chest x-ray and bilateral pleural effusions.  Ins and outs not charted yesterday but creatinine is stable, breathing somewhat better, still out of breath with exertion. -Continue Lasix - Daily BMP - Strict intake and output   Bilateral pleural effusions Had R thoracentesis 8/31, 1 L clearish fluid, transudative by incomplete Lights. Still a lot of fluid on the left on CXR, given clinical benefit from thora on R and large volume on R, will attempt on L - Repeat Ultrasound-guided thoracentesis ordered  COVID Mild symptoms, high risk given symptoms. - Continue Paxlovid - Hold statin - Continue steroids for today, low threshold to stop, I think this is more CHF   COPD No evidence of flare - Continue home ICS/LABA/LAMA  Hypertension Coronary disease BP normal -Continue metoprolol - Hold atorvastatin        Disposition: Status is: Inpatient  Remains inpatient appropriate because: still very out of breath with exertion  Dispo: The patient is from: Home              Anticipated d/c is to: Home              Patient currently is not medically stable to d/c.   Difficult to place patient No       Level of care: Med-Surg       MDM: The below  labs and imaging reports were reviewed and summarized above.  Medication management as above.     DVT prophylaxis: enoxaparin (LOVENOX) injection 40 mg Start: 04/12/21 1245 Place TED hose Start: 04/10/21 2030  Code Status: FULL Family Communication: daughter by phone          Subjective: Still short of breath with exertion.  Swelling resolved.  Still with some orthopnea..  No sore throat, runny nose, myalgias, fever.  No chest pain.  Objective: Vitals:   04/12/21 0516 04/12/21 0827 04/12/21 1250 04/12/21 1600  BP: 112/81 117/88 122/78 114/77  Pulse: 66 74 82 72  Resp: 20 18 18 16   Temp: (!) 97.3 F (36.3 C) 97.8 F (36.6 C) 98 F (36.7 C) 98 F (36.7 C)  TempSrc: Oral Oral Oral   SpO2: 94% 98% 98% 100%  Weight:      Height:        Intake/Output Summary (Last 24 hours) at 04/12/2021 1712 Last data filed at 04/12/2021 1700 Gross per 24 hour  Intake 240 ml  Output 1400 ml  Net -1160 ml   Filed Weights   04/10/21 1452 04/11/21 2036  Weight: 68.9 kg 69.2 kg    Examination: General appearance: Elderly adult male, lying in bed, no acute distress     HEENT:    Skin:  Cardiac: RRR, no murmurs, no lower extremity edema Respiratory: Normal respiratory rate  and rhythm at rest, dyspneic with exertion, lung sounds diminished at the left base, some crackles at the left Abdomen: Tenderness palpation or guarding, no ascites or distention MSK:  Neuro: Awake and alert, extraocular movements intact, moves all extremities with normal strength and coordination, speech fluent Psych: Sensorium intact responding questions, attention normal, affect normal, judgment insight appear normal     Data Reviewed: I have personally reviewed following labs and imaging studies:  CBC: Recent Labs  Lab 04/10/21 1500 04/11/21 0540  WBC 5.3 5.2  HGB 13.2 13.6  HCT 38.7* 39.7  MCV 95.3 93.4  PLT 211 209   Basic Metabolic Panel: Recent Labs  Lab 04/10/21 1500 04/11/21 0540  04/12/21 0529  NA 137 138 137  K 4.0 3.8 4.3  CL 97* 96* 97*  CO2 32 34* 31  GLUCOSE 114* 98 126*  BUN 28* 28* 31*  CREATININE 1.09 0.99 1.08  CALCIUM 8.8* 8.5* 8.9   GFR: Estimated Creatinine Clearance: 53.4 mL/min (by C-G formula based on SCr of 1.08 mg/dL). Liver Function Tests: Recent Labs  Lab 04/11/21 0540 04/12/21 0529  AST 77* 58*  ALT 62* 56*  ALKPHOS 129* 128*  BILITOT 0.8 0.7  PROT 7.3 7.3  ALBUMIN 3.3* 3.2*   No results for input(s): LIPASE, AMYLASE in the last 168 hours. No results for input(s): AMMONIA in the last 168 hours. Coagulation Profile: Recent Labs  Lab 04/10/21 2256  INR 1.1   Cardiac Enzymes: No results for input(s): CKTOTAL, CKMB, CKMBINDEX, TROPONINI in the last 168 hours. BNP (last 3 results) No results for input(s): PROBNP in the last 8760 hours. HbA1C: No results for input(s): HGBA1C in the last 72 hours. CBG: No results for input(s): GLUCAP in the last 168 hours. Lipid Profile: No results for input(s): CHOL, HDL, LDLCALC, TRIG, CHOLHDL, LDLDIRECT in the last 72 hours. Thyroid Function Tests: No results for input(s): TSH, T4TOTAL, FREET4, T3FREE, THYROIDAB in the last 72 hours. Anemia Panel: Recent Labs    04/11/21 0540 04/12/21 0529  FERRITIN 184 178   Urine analysis: No results found for: COLORURINE, APPEARANCEUR, LABSPEC, PHURINE, GLUCOSEU, HGBUR, BILIRUBINUR, KETONESUR, PROTEINUR, UROBILINOGEN, NITRITE, LEUKOCYTESUR Sepsis Labs: @LABRCNTIP (procalcitonin:4,lacticacidven:4)  ) Recent Results (from the past 240 hour(s))  Resp Panel by RT-PCR (Flu A&B, Covid) Nasopharyngeal Swab     Status: Abnormal   Collection Time: 04/10/21  7:14 PM   Specimen: Nasopharyngeal Swab; Nasopharyngeal(NP) swabs in vial transport medium  Result Value Ref Range Status   SARS Coronavirus 2 by RT PCR POSITIVE (A) NEGATIVE Final    Comment: RESULT CALLED TO, READ BACK BY AND VERIFIED WITH: tia bullock @2013  on 04/10/21 skl (NOTE) SARS-CoV-2  target nucleic acids are DETECTED.  The SARS-CoV-2 RNA is generally detectable in upper respiratory specimens during the acute phase of infection. Positive results are indicative of the presence of the identified virus, but do not rule out bacterial infection or co-infection with other pathogens not detected by the test. Clinical correlation with patient history and other diagnostic information is necessary to determine patient infection status. The expected result is Negative.  Fact Sheet for Patients:  Fact Sheet for Healthcare Providers: 04/12/21  This test is not yet approved or cleared by the [employer] FDA and  has been authorized for detection and/or diagnosis of SARS-CoV-2 by FDA under an Emergency Use Authorization (EUA).  This EUA will remain in effect (meaning this test can be  used) for the duration of  the COVID-19 declaration under Section 564(b)(1) of the  Act, 21 U.S.C. section 360bbb-3(b)(1), unless the authorization is terminated or revoked sooner.     Influenza A by PCR NEGATIVE NEGATIVE Final   Influenza B by PCR NEGATIVE NEGATIVE Final    Comment: (NOTE) The Xpert Xpress SARS-CoV-2/FLU/RSV plus assay is intended as an aid in the diagnosis of influenza from Nasopharyngeal swab specimens and should not be used as a sole basis for treatment. Nasal washings and aspirates are unacceptable for Xpert Xpress SARS-CoV-2/FLU/RSV testing.  Fact Sheet for Patients: [employer]com  Fact Sheet for Healthcare Providers: SeriousBroker.it  This test is not yet approved or cleared by the [place] FDA and has been authorized for detection and/or diagnosis of SARS-CoV-2 by FDA under an Emergency Use Authorization (EUA). This EUA will remain in effect (meaning this test can be used) for the duration of the COVID-19 declaration under Section  564(b)(1) of the Act, 21 U.S.C. section 360bbb-3(b)(1), unless the authorization is terminated or revoked.  Performed at [facility], [address] [place], [place] [address]   Body fluid culture w Gram Stain     Status: None (Preliminary result)   Collection Time: 04/11/21 10:34 AM   Specimen: PATH Cytology Pleural fluid  Result Value Ref Range Status   Specimen Description   Final    PLEURAL Performed at [facility], [address], [place], [place] [address]    Special Requests   Final    NONE Performed at [facility], [address] [place], [place] [address]    Gram Stain   Final    RARE WBC PRESENT, PREDOMINANTLY MONONUCLEAR NO ORGANISMS SEEN    Culture   Final    NO GROWTH < 24 HOURS Performed at [facility], [address], [place], [place] [address]    Report Status PENDING  Incomplete         Radiology Studies: DG Chest Port 1 View  Result Date: 04/11/2021 CLINICAL DATA:  Post thoracentesis EXAM: PORTABLE CHEST 1 VIEW COMPARISON:  Radiograph 04/10/2021 FINDINGS: Unchanged cardiomediastinal silhouette. Unchanged diffuse interstitial opacities. Persistent bilateral pleural effusions, decreased on the right in comparison to prior exam. There is no visible pneumothorax. No acute osseous abnormality. IMPRESSION: Persistent bilateral pleural effusions, decreased on the right in comparison prior exam. No visible pneumothorax. Electronically Signed   By: Caprice Renshaw M.D.   On: 04/11/2021 11:22   US THORACENTESIS ASP PLEURAL SPACE W/IMG GUIDE  Result Date: 04/11/2021 INDICATION: Bilateral pleural effusions, request received for diagnostic and therapeutic thoracentesis. EXAM: ULTRASOUND GUIDED RIGHT THORACENTESIS MEDICATIONS: 1% lidocaine only. COMPLICATIONS: None immediate. PROCEDURE: An ultrasound guided thoracentesis was thoroughly discussed with the patient and questions answered. The benefits, risks, alternatives and  complications were also discussed. The patient understands and wishes to proceed with the procedure. Written consent was obtained. Ultrasound was performed to localize and mark an adequate pocket of fluid in the right chest. The area was then prepped and draped in the normal sterile fashion. 1% Lidocaine was used for local anesthesia. Under ultrasound guidance a 19 gauge, 7-cm, Yueh catheter was introduced. Thoracentesis was performed. The catheter was removed and a dressing applied. FINDINGS: A total of approximately 1000 mL of clear yellow fluid was removed. Samples were sent to the laboratory as requested by the clinical team. IMPRESSION: Successful ultrasound guided right thoracentesis yielding 1 L of pleural fluid. Read By: Pattricia Boss PA-C Electronically Signed   By: Acquanetta Belling M.D.   On: 04/11/2021 10:53  Scheduled Meds:  dexamethasone (DECADRON) injection  6 mg Intravenous Daily   enoxaparin (LOVENOX) injection  40 mg Subcutaneous Q24H   feeding supplement  237 mL Oral TID BM   fluticasone furoate-vilanterol  1 puff Inhalation QPM   And   umeclidinium bromide  1 puff Inhalation QPM   furosemide  40 mg Intravenous BID   metoprolol succinate  25 mg Oral Daily   [START ON 04/13/2021] multivitamin with minerals  1 tablet Oral Daily   nirmatrelvir/ritonavir EUA  3 tablet Oral BID   nystatin  5 mL Mouth/Throat QID   spironolactone  25 mg Oral Daily   Continuous Infusions:     LOS: 1 day    Time spent: 25 minutes    Alberteen Sam, MD [facility] 04/12/2021, 5:12 PM     Please page though AMION or Epic secure chat:  For [employer], Higher education careers adviser

## 2021-04-12 NOTE — Evaluation (Signed)
Physical Therapy Evaluation Patient Details Name: Todd Perez MRN: [id number] DOB: [birth year] Today's Date: 04/12/2021   History of Present Illness  Patient is a 80 y.o. 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 Found to be COVID positive. s/p ultrasound guided right thoracentesis yielding 1 L of pleural fluid 8/31.   Clinical Impression  Patient agreeable to PT evaluation. Patient is previously independent with mobility, normally drives, and lives with spouse at baseline. Patient reports he feels better now than he did at home.  He is currently independent with bed mobility and transfers with good safety awareness demonstrated. Modified independent to walk 197ft in the room without assistive device, no loss of balance, no difficulty navigating obstacles. Patient reports mild shortness of breath with activity. Sp02 99% on room air at rest and 97% on room air with standing activity. Educated patient on energy conservation techniques and walking for conditioning at home. No further acute PT needs are identified as patient is likely nearing his baseline level of functional mobility.     Follow Up Recommendations No PT follow up    Equipment Recommendations  None recommended by PT    Recommendations for Other Services       Precautions / Restrictions Precautions Precautions: Fall Restrictions Weight Bearing Restrictions: No      Mobility  Bed Mobility Overal bed mobility: Independent                  Transfers Overall transfer level: Independent Equipment used: None             General transfer comment: no loss of balance with functional transfers. no dizziness reported in standing. educated patient to use caution and monitor for any symptoms of dizziness prior to mobilizing at home  Ambulation/Gait Ambulation/Gait assistance: Modified independent (Device/Increase time) Gait Distance (Feet): 100 Feet Assistive  device: None Gait Pattern/deviations: Step-through pattern Gait velocity: decreased   General Gait Details: no loss of balance with ambulation. patient able to negotiate around obstacles in the room without difficulty. Sp02 99% on room air and 97% with standing activity. mild shortness of breath reported with activity. educated patient on energy conservation technique and tips provided for a walking scheduled for home for conditioning  Stairs            Wheelchair Mobility    Modified Rankin (Stroke Patients Only)       Balance                                             Pertinent Vitals/Pain Pain Assessment: No/denies pain    Home Living Family/patient expects to be discharged to:: Private residence Living Arrangements: Spouse/significant other Available Help at Discharge: Family Type of Home: House Home Access: Stairs to enter   Secretary/administrator of Steps: 3-4 Home Layout: Able to live on main level with bedroom/bathroom        Prior Function Level of Independence: Independent               Hand Dominance   Dominant Hand: Right    Extremity/Trunk Assessment   Upper Extremity Assessment Upper Extremity Assessment: Overall WFL for tasks assessed (good grip strength bilaterall, forward shoulder flexion 5/5)    Lower Extremity Assessment Lower Extremity Assessment: Overall WFL for tasks assessed (BLE 5/5 knee extension, dorsiflexion, plantarflexion)  Communication   Communication: No difficulties  Cognition Arousal/Alertness: Awake/alert Behavior During Therapy: WFL for tasks assessed/performed Overall Cognitive Status: Within Functional Limits for tasks assessed                                 General Comments: patient cooperative and able to follow all commands without difficulty      General Comments      Exercises     Assessment/Plan    PT Assessment Patent does not need any further PT  services  PT Problem List         PT Treatment Interventions      PT Goals (Current goals can be found in the Care Plan section)  Acute Rehab PT Goals Patient Stated Goal: to go home PT Goal Formulation: With patient Time For Goal Achievement: 04/12/21 Potential to Achieve Goals: Good    Frequency     Barriers to discharge        Co-evaluation               AM-PAC PT "6 Clicks" Mobility  Outcome Measure Help needed turning from your back to your side while in a flat bed without using bedrails?: None Help needed moving from lying on your back to sitting on the side of a flat bed without using bedrails?: None Help needed moving to and from a bed to a chair (including a wheelchair)?: None Help needed standing up from a chair using your arms (e.g., wheelchair or bedside chair)?: None Help needed to walk in hospital room?: None Help needed climbing 3-5 steps with a railing? : None 6 Click Score: 24    End of Session   Activity Tolerance: Patient tolerated treatment well Patient left: in bed;with call bell/phone within reach Nurse Communication: Mobility status PT Visit Diagnosis: Muscle weakness (generalized) (M62.81)    Time: 1610-9604 PT Time Calculation (min) (ACUTE ONLY): 22 min   Charges:   PT Evaluation $PT Eval Moderate Complexity: 1 Mod PT Treatments $Therapeutic Activity: 8-22 mins        .TVR  Ina Homes 04/12/2021, 9:32 AM

## 2021-04-12 NOTE — Care Management Important Message (Signed)
Important Message  Patient Details  Name: DEVAUN HERNANDEZ MRN: [id number] Date of Birth: June 02, 1941   Medicare Important Message Given:  N/A - LOS <3 / Initial given by admissions  Initial Medicare IM reviewed with patient by Anselmo Rod, Patient Access Associate on 04/12/2021 at 11:10am.   Johnell Comings 04/12/2021, 7:28 PM

## 2021-04-12 NOTE — Progress Notes (Signed)
Initial Nutrition Assessment  DOCUMENTATION CODES:   Severe malnutrition in context of chronic illness  INTERVENTION:   Ensure Enlive po TID, each supplement provides 350 kcal and 20 grams of protein  Magic cup TID with meals, each supplement provides 290 kcal and 9 grams of protein  MVI po daily   Liberalize diet   Pt at high refeed risk; recommend monitor potassium, magnesium and phosphorus labs daily until stable  NUTRITION DIAGNOSIS:   Severe Malnutrition related to chronic illness (COPD, CHF) as evidenced by severe fat depletion, severe muscle depletion.  GOAL:   Patient will meet greater than or equal to 90% of their needs  MONITOR:   PO intake, Supplement acceptance, Labs, Weight trends, Skin, I & O's  REASON FOR ASSESSMENT:   Consult Assessment of nutrition requirement/status  ASSESSMENT:   80 y.o. male with medical history significant for hypertension, CAD, hyperlipidemia, gout, heart failure reduced ejection fraction, COPD, NSTEMI and DM who is admitted with COVID 19 and thrush  Pt s/p thoracentesis 8/31 with 1.0L output  Met with pt in room today. Pt up walking around room at time of RD visit. Pt's lunch tray was sitting on his side table and was about 50% eaten. Pt reports poor appetite and oral intake at baseline. Pt reports that he just kind of snacks all day at home. Pt reports that he tries to avoid all foods with salt including everything processed, canned and restaurant foods. Pt reports that he used to drink Ensure at home but that he quit drinking it as someone told him it had too much sodium. RD discussed with pt the importance of adequate nutrition needed to preserve lean muscle in relation to his disease processes's. RD believes that patient may be over-restricting calories in fear that he will eat too much sodium. Educated pt regarding supplements and their purpose. Recommended that patient start drinking 3 supplements every day. Pt reports that his  UBW is around 180lbs and that he has lost a significant amount of weight over the past few months. Per chart, pt does not appear to have weighed over 180lbs for several years; pt's UBW is ~165lbs. Per appears to have lost 14lbs(8%) over the past 7 months. RD will add supplements and MVI to help pt meet his estimated needs (prefers strawberry). RD will also liberalize pt's diet as pt is not eating enough to exceed nutrient limits and the heart healthy diet is restrictive of protein.   Medications reviewed and include: dexamethasone, lovenox, lasix, MVI, aldactone  Labs reviewed: K 4.3 wnl, BUN 31(H)  NUTRITION - FOCUSED PHYSICAL EXAM:  Flowsheet Row Most Recent Value  Orbital Region Moderate depletion  Upper Arm Region Severe depletion  Thoracic and Lumbar Region Severe depletion  Buccal Region Moderate depletion  Temple Region Moderate depletion  Clavicle Bone Region Severe depletion  Clavicle and Acromion Bone Region Severe depletion  Scapular Bone Region Severe depletion  Dorsal Hand Moderate depletion  Patellar Region Severe depletion  Anterior Thigh Region Severe depletion  Posterior Calf Region Severe depletion  Edema (RD Assessment) None  Hair Reviewed  Eyes Reviewed  Mouth Reviewed  Skin Reviewed  Nails Reviewed   Diet Order:   Diet Order             Diet 2 gram sodium Room service appropriate? Yes; Fluid consistency: Thin  Diet effective now                  EDUCATION NEEDS:   Education needs have  been addressed  Skin:  Skin Assessment: Reviewed RN Assessment  Last BM:  pta  Height:   Ht Readings from Last 1 Encounters:  04/11/21 _0  (1.854 m)    Weight:   Wt Readings from Last 1 Encounters:  04/11/21 69.2 kg    Ideal Body Weight:  83.6 kg  BMI:  Body mass index is 20.13 kg/m.  Estimated Nutritional Needs:   Kcal:  1900-2200kcal/day  Protein:  95-110g/day  Fluid:  1.7L/day  Koleen Distance MS, RD, LDN Please refer to [facility] for RD  and/or RD on-call/weekend/after hours pager

## 2021-04-12 NOTE — Consult Note (Addendum)
   Heart Failure Nurse Navigator Note  HFrEF 25-30%.  Severe LVH.  Right ventricular systolic function is normal.  Moderate biatrial enlargement.  Moderate mitral regurgitation.  Moderate tricuspid regurgitation.   He presented to the emergency room with complaints of increasing shortness of breath and a cough, PND and orthopnea.  He was a no-show at the outpatient heart failure clinic after his hospitalization in March of this year.   Comorbidities:  Hypertension Coronary artery disease Hyperlipidemia Gout  Medications:  Lasix 40 mg IV twice a day Metoprolol succinate 25 mg daily Spironolactone 25 mg daily  Labs:  Sodium 137, potassium 4.3, chloride 97, CO2 31, BUN 31, creatinine 1.08 Weight documented at 69.2 up from 68.9 of yesterday 1000 cc was removed by thoracentesis on the right Intake not documented Output 1200 mL   Initial meeting with patient on this admission.  He states at home that he had been eating and drinking foods that are not on a low-sodium diet.  He states he noticed that he had done wrong and was trying to get back on track.  It also noted that normally he could climb the stairs without having to stop but prior to admission could only get halfway up the stairs when he had to stop due to shortness of breath.  He had also noted some swelling in his legs that was present in the morning when he woke up.  He states that he had been weighing himself daily had an not noted a weight gain.  Discussed notifying his physicians if he sees a change in his symptoms, he voices understanding.  He had no further questions.  Tresa Endo RN [facility]

## 2021-04-13 ENCOUNTER — Inpatient Hospital Stay: Payer: Medicare Other

## 2021-04-13 LAB — COMPREHENSIVE METABOLIC PANEL
ALT: 68 U/L — ABNORMAL HIGH (ref 0–44)
AST: 73 U/L — ABNORMAL HIGH (ref 15–41)
Albumin: 3.2 g/dL — ABNORMAL LOW (ref 3.5–5.0)
Alkaline Phosphatase: 132 U/L — ABNORMAL HIGH (ref 38–126)
Anion gap: 6 (ref 5–15)
BUN: 37 mg/dL — ABNORMAL HIGH (ref 8–23)
CO2: 37 mmol/L — ABNORMAL HIGH (ref 22–32)
Calcium: 8.9 mg/dL (ref 8.9–10.3)
Chloride: 93 mmol/L — ABNORMAL LOW (ref 98–111)
Creatinine, Ser: 1.25 mg/dL — ABNORMAL HIGH (ref 0.61–1.24)
GFR, Estimated: 58 mL/min — ABNORMAL LOW (ref >60)
Glucose, Bld: 124 mg/dL — ABNORMAL HIGH (ref 70–99)
Potassium: 4.4 mmol/L (ref 3.5–5.1)
Sodium: 136 mmol/L (ref 135–145)
Total Bilirubin: 0.7 mg/dL (ref 0.3–1.2)
Total Protein: 8 g/dL (ref 6.5–8.1)

## 2021-04-13 LAB — C-REACTIVE PROTEIN: CRP: 0.5 mg/dL (ref <1.0)

## 2021-04-13 MED ORDER — FUROSEMIDE 20 MG PO TABS
20.0000 mg | ORAL_TABLET | Freq: Two times a day (BID) | ORAL | Status: DC
  Administered 2021-04-13: 20 mg via ORAL

## 2021-04-13 NOTE — Discharge Summary (Signed)
Physician Discharge Summary  Todd Perez QQI:[id number] DOB: Jun 21, 1941 DOA: 04/10/2021  PCP: [name], MD  Admit date: 04/10/2021 Discharge date: 04/14/2021  Admitted From: Home  Disposition:  Home   Recommendations for Outpatient Follow-up:  Follow up with PCP in 1-2 weeks Follow up with Cardiology in 2 weeks Dr. Burnett Sheng: Please obtain BMP in one week      Home Health: None   Equipment/Devices: None new  Discharge Condition: Good  CODE STATUS: FULL Diet recommendation: Cardiac  Brief/Interim Summary: Mr. Salemi is a 80 y.o. 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 This progressed to severe dyspnea with exertion and orthopnea so he came to the ER.  In the ER, chest x-ray showed bilateral opacities with effusions, COVID-positive, troponin minimally elevated and flat.  Started on Lasix and heparin infusion.           PRINCIPAL HOSPITAL DIAGNOSIS: Acute on chronic systolic CHF    Discharge Diagnoses:   Acute on chronic systolic CHF Presented with dyspnea on exertion, orthopnea, leg swelling, bilateral infiltrates on chest x-ray and bilateral pleural effusions.   He was treated with IV Lasix, underwent bilateral thoracenteses which yielded 1L and 1.1L of transudate, felt better afterwards, back to baseline.    Discharged back on home Lasix, with close Cardiology follow up.     Bilateral pleural effusions Had R thoracentesis 8/31, 1 L clearish fluid, transudative by incomplete Lights.    Left thoracentesis on 9/2 with 1.1L.    Post-thora CXR showed very small pneumothorax. No pain or dyspnea with ambulation.  Observed overnight and morning CXR showed stable small apical pneumothorax.  Stable for discharge. Return precautions given.    COVID Mild symptoms, high risk given CHF.  Started on Paxlovid, discharged to complete 5 days.  COPD No evidence of flare  Hypertension Coronary  disease             Discharge Instructions  Discharge Instructions     Diet - low sodium heart healthy   Complete by: As directed    Discharge instructions   Complete by: As directed    From Dr. Maryfrances Bunnell: You were admitted for a CHF flare You also were found to have COVID  For the CHF flare, you were treated with Lasix and had two thoracenteses to remove fluid from around the lungs.  You should resume your normal home Lasix tomorrow Weigh yourself daily  Go see your PCP in 2 weeks Go see your Cardiologist for follow up as well  For COVID You should isolate for 5 days Finish the course of Paxlovid, the antiviral medicine, to prevent severe outcomes from COVID  Take Paxlovid (2 tabs in morning and 2 tabs at night) for 3 more days (Saturday, Sunday and Monday), to complete a 5 day course There may be some tabs left over  While you are on Paxlovid, do NOT take your atorvastatin (cholesterol medicine) In 1 week, you can restart the atorvastatin  You may continue your other home medicines,without change.  If you have chest pain or trouble breathing, suddenly, you should return to the ER.   Increase activity slowly   Complete by: As directed       Allergies as of 04/14/2021       Reactions   Benicar Hct [olmesartan Medoxomil-hctz] Itching   Lisinopril Cough        Medication List     STOP taking these medications  atorvastatin 40 MG tablet Commonly known as: LIPITOR       TAKE these medications    albuterol 108 (90 Base) MCG/ACT inhaler Commonly known as: VENTOLIN HFA Inhale 2 puffs into the lungs every 4 (four) hours as needed.   allopurinol 100 MG tablet Commonly known as: ZYLOPRIM Take 100 mg by mouth daily as needed (gout prevention).   aspirin 81 MG chewable tablet Chew 1 tablet (81 mg total) by mouth daily.   feeding supplement Liqd Take 237 mLs by mouth 3 (three) times daily between meals.   furosemide 20 MG tablet Commonly known  as: LASIX Take 20 mg by mouth 2 (two) times daily.   metoprolol succinate 25 MG 24 hr tablet Commonly known as: TOPROL-XL Take 1 tablet (25 mg total) by mouth daily.   nirmatrelvir/ritonavir EUA 20 x 150 MG & 10 x 100MG  Tabs Commonly known as: PAXLOVID Take 3 tablets by mouth 2 (two) times daily for 3 days. Patient GFR is 45.   Potassium Chloride ER 20 MEQ Tbcr Take 20 mEq by mouth daily.   Spiriva Respimat 2.5 MCG/ACT Aers Generic drug: Tiotropium Bromide Monohydrate Inhale 2 puffs into the lungs daily.   spironolactone 25 MG tablet Commonly known as: ALDACTONE Take 25 mg by mouth daily.   Trelegy Ellipta 100-62.5-25 MCG/INH Aepb Generic drug: Fluticasone-Umeclidin-Vilant Inhale 1 puff into the lungs daily.        Follow-up Information     [name], MD. Schedule an appointment as soon as possible for a visit in 2 week(s).   Specialty: Family Medicine Contact information: [address] [facility] [place] [address] [phone]         Dalia Heading, MD. Schedule an appointment as soon as possible for a visit in 2 week(s).   Specialty: Cardiology Contact information: [address] [place] [address] [phone]                Allergies  Allergen Reactions   Benicar Hct [Olmesartan Medoxomil-Hctz] Itching   Lisinopril Cough       Procedures/Studies: DG Chest 2 View  Result Date: 04/10/2021 CLINICAL DATA:  Reason for exam: sob Pt reports sob and cough for a couple of days, denies any cp. Reports hx of CHF and a lung disorder but unsure of the name. Hx of htn, ischemic cardiomyopathy, CHF, CAD EXAM: CHEST - 2 VIEW COMPARISON:  11/23/2020 FINDINGS: Moderate bilateral pleural effusions. Some improvement in the interstitial edema seen previously. Patchy atelectasis/consolidation in the lung bases as before. Size difficult to assess due to adjacent opacities. Aortic Atherosclerosis (ICD10-170.0). No pneumothorax.  Visualized bones unremarkable. IMPRESSION: 1. Interval improvement in interstitial edema. 2. Persistent bilateral pleural effusions with bibasilar consolidation/atelectasis Electronically Signed   By: Corlis Leak M.D.   On: 04/10/2021 15:54   DG Chest Port 1 View  Result Date: 04/14/2021 CLINICAL DATA:  Check for pneumothorax EXAM: PORTABLE CHEST 1 VIEW COMPARISON:  Chest radiograph from one day prior. FINDINGS: Stable cardiomediastinal silhouette with mild cardiomegaly. Small left apical pneumothorax, approximately 5%, not substantially changed. Reaccumulation of small basilar left pleural effusion. Stable small right pleural effusion. No right pneumothorax. Borderline mild pulmonary edema is unchanged. Stable bibasilar atelectasis. IMPRESSION: 1. Stable small left apical pneumothorax, approximately 5%. 2. Reaccumulation of small basilar left pleural effusion. 3. Stable small right pleural effusion. 4. Stable borderline mild congestive heart failure and bibasilar atelectasis. Electronically Signed   By: Delbert Phenix M.D.   On: 04/14/2021 10:11  DG Chest Port 1 View  Result Date: 04/13/2021 CLINICAL DATA:  Post thoracentesis. EXAM: PORTABLE CHEST 1 VIEW COMPARISON:  Chest radiograph, 04/11/2021.  CT chest, 11/20/2020. FINDINGS: Support lines: None Cardiomediastinal silhouette is moderately enlarged. The LEFT lung is clear. Trace LEFT apical pneumothorax. Small-to-moderate volume RIGHT pleural effusion. No interval osseous abnormality. IMPRESSION: 1. Trace LEFT apical pneumothorax. 2. Small-to-moderate volume RIGHT pleural effusion. These results were called by telephone at the time of interpretation on 04/13/2021 at 5:47 pm to provider who verbally acknowledged these results. Electronically Signed   By: Roanna Banning M.D.   On: 04/13/2021 17:49   DG Chest Port 1 View  Result Date: 04/11/2021 CLINICAL DATA:  Post thoracentesis EXAM: PORTABLE CHEST 1 VIEW COMPARISON:  Radiograph 04/10/2021 FINDINGS: Unchanged  cardiomediastinal silhouette. Unchanged diffuse interstitial opacities. Persistent bilateral pleural effusions, decreased on the right in comparison to prior exam. There is no visible pneumothorax. No acute osseous abnormality. IMPRESSION: Persistent bilateral pleural effusions, decreased on the right in comparison prior exam. No visible pneumothorax. Electronically Signed   By: Caprice Renshaw M.D.   On: 04/11/2021 11:22   US THORACENTESIS ASP PLEURAL SPACE W/IMG GUIDE  Result Date: 04/13/2021 INDICATION: Symptomatic LEFT sided pleural effusion EXAM: US THORACENTESIS ASP PLEURAL SPACE W/IMG GUIDE COMPARISON:  Chest radiograph comment same day. MEDICATIONS: None. COMPLICATIONS: None immediate. TECHNIQUE: Informed written consent was obtained from the patient after a discussion of the risks, benefits and alternatives to treatment. A timeout was performed prior to the initiation of the procedure. Initial ultrasound scanning demonstrates a LEFT pleural effusion. The lower chest was prepped and draped in the usual sterile fashion. 1% lidocaine was used for local anesthesia. An ultrasound image was saved for documentation purposes. An 8 Fr Safe-T-Centesis catheter was introduced. The thoracentesis was performed. The catheter was removed and a dressing was applied. The patient tolerated the procedure well without immediate post procedural complication. A postprocedural upright chest radiograph was requested. FINDINGS: A total of approximately 1.0 liters of serous pleural fluid was removed. Requested samples were sent to the laboratory. IMPRESSION: Successful ultrasound-guided LEFT sided therapeutic thoracentesis yielding 1.0 liters of pleural fluid. Roanna Banning, MD [facility] Electronically Signed   By: Roanna Banning M.D.   On: 04/13/2021 18:08   US THORACENTESIS ASP PLEURAL SPACE W/IMG GUIDE  Result Date: 04/11/2021 INDICATION: Bilateral pleural effusions,  request received for diagnostic and therapeutic thoracentesis. EXAM: ULTRASOUND GUIDED RIGHT THORACENTESIS MEDICATIONS: 1% lidocaine only. COMPLICATIONS: None immediate. PROCEDURE: An ultrasound guided thoracentesis was thoroughly discussed with the patient and questions answered. The benefits, risks, alternatives and complications were also discussed. The patient understands and wishes to proceed with the procedure. Written consent was obtained. Ultrasound was performed to localize and mark an adequate pocket of fluid in the right chest. The area was then prepped and draped in the normal sterile fashion. 1% Lidocaine was used for local anesthesia. Under ultrasound guidance a 19 gauge, 7-cm, Yueh catheter was introduced. Thoracentesis was performed. The catheter was removed and a dressing applied. FINDINGS: A total of approximately 1000 mL of clear yellow fluid was removed. Samples were sent to the laboratory as requested by the clinical team. IMPRESSION: Successful ultrasound guided right thoracentesis yielding 1 L of pleural fluid. Read By: Pattricia Boss PA-C Electronically Signed   By: Acquanetta Belling M.D.   On: 04/11/2021 10:53      Subjective: Patient feels well, no dyspnea, no chest pain, no orthopnea, no swelling.  Discharge Exam: Vitals:  04/14/21 0424 04/14/21 0800  BP: (!) 118/93 108/86  Pulse: 74 75  Resp: 18 18  Temp: 98.5 F (36.9 C) 98 F (36.7 C)  SpO2: 100% 98%   Vitals:   04/13/21 1222 04/13/21 1500 04/14/21 0424 04/14/21 0800  BP: 110/68 120/62 (!) 118/93 108/86  Pulse: 68  74 75  Resp: 17 17 18 18   Temp: 97.7 F (36.5 C) 98 F (36.7 C) 98.5 F (36.9 C) 98 F (36.7 C)  TempSrc: Oral Oral Oral Oral  SpO2: 99% 99% 100% 98%  Weight:      Height:        General: Pt is alert, awake, not in acute distress Cardiovascular: RRR, nl S1-S2, no murmurs appreciated.   No LE edema.   Respiratory: Normal respiratory rate and rhythm.  CTAB without rales or wheezes. Abdominal:  Abdomen soft and non-tender.  No distension or HSM.   Neuro/Psych: Strength symmetric in upper and lower extremities.  Judgment and insight appear normal.   The results of significant diagnostics from this hospitalization (including imaging, microbiology, ancillary and laboratory) are listed below for reference.     Microbiology: Recent Results (from the past 240 hour(s))  Resp Panel by RT-PCR (Flu A&B, Covid) Nasopharyngeal Swab     Status: Abnormal   Collection Time: 04/10/21  7:14 PM   Specimen: Nasopharyngeal Swab; Nasopharyngeal(NP) swabs in vial transport medium  Result Value Ref Range Status   SARS Coronavirus 2 by RT PCR POSITIVE (A) NEGATIVE Final    Comment: RESULT CALLED TO, READ BACK BY AND VERIFIED WITH: tia bullock @2013  on 04/10/21 skl (NOTE) SARS-CoV-2 target nucleic acids are DETECTED.  The SARS-CoV-2 RNA is generally detectable in upper respiratory specimens during the acute phase of infection. Positive results are indicative of the presence of the identified virus, but do not rule out bacterial infection or co-infection with other pathogens not detected by the test. Clinical correlation with patient history and other diagnostic information is necessary to determine patient infection status. The expected result is Negative.  Fact Sheet for Patients: BloggerCourse.com  Fact Sheet for Healthcare Providers: SeriousBroker.it  This test is not yet approved or cleared by the [place] FDA and  has been authorized for detection and/or diagnosis of SARS-CoV-2 by FDA under an Emergency Use Authorization (EUA).  This EUA will remain in effect (meaning this test can be  used) for the duration of  the COVID-19 declaration under Section 564(b)(1) of the Act, 21 U.S.C. section 360bbb-3(b)(1), unless the authorization is terminated or revoked sooner.     Influenza A by PCR NEGATIVE NEGATIVE Final   Influenza B by PCR  NEGATIVE NEGATIVE Final    Comment: (NOTE) The Xpert Xpress SARS-CoV-2/FLU/RSV plus assay is intended as an aid in the diagnosis of influenza from Nasopharyngeal swab specimens and should not be used as a sole basis for treatment. Nasal washings and aspirates are unacceptable for Xpert Xpress SARS-CoV-2/FLU/RSV testing.  Fact Sheet for Patients: BloggerCourse.com  Fact Sheet for Healthcare Providers: SeriousBroker.it  This test is not yet approved or cleared by the [place] FDA and has been authorized for detection and/or diagnosis of SARS-CoV-2 by FDA under an Emergency Use Authorization (EUA). This EUA will remain in effect (meaning this test can be used) for the duration of the COVID-19 declaration under Section 564(b)(1) of the Act, 21 U.S.C. section 360bbb-3(b)(1), unless the authorization is terminated or revoked.  Performed at [facility], [address], [place], [place] [address]   Body fluid  culture w Gram Stain     Status: None   Collection Time: 04/11/21 10:34 AM   Specimen: PATH Cytology Pleural fluid  Result Value Ref Range Status   Specimen Description   Final    PLEURAL Performed at [facility], [address] [place], [place] [address]    Special Requests   Final    NONE Performed at [facility], [address] [place], [place] [address]    Gram Stain   Final    RARE WBC PRESENT, PREDOMINANTLY MONONUCLEAR NO ORGANISMS SEEN    Culture   Final    NO GROWTH 3 DAYS Performed at [facility], [address] [place], [place] [address]    Report Status 04/14/2021 FINAL  Final     Labs: BNP (last 3 results) Recent Labs    08/15/20 1610 11/23/20 0808 04/10/21 1500  BNP 885.6* 782.8* 1,127.9*   Basic Metabolic Panel: Recent Labs  Lab 04/10/21 1500 04/11/21 0540 04/12/21 0529 04/13/21 0422 04/14/21 0420  NA 137 138 137 136 139  K 4.0 3.8 4.3 4.4 4.3   CL 97* 96* 97* 93* 97*  CO2 32 34* 31 37* 36*  GLUCOSE 114* 98 126* 124* 139*  BUN 28* 28* 31* 37* 38*  CREATININE 1.09 0.99 1.08 1.25* 1.03  CALCIUM 8.8* 8.5* 8.9 8.9 8.9   Liver Function Tests: Recent Labs  Lab 04/11/21 0540 04/12/21 0529 04/13/21 0422  AST 77* 58* 73*  ALT 62* 56* 68*  ALKPHOS 129* 128* 132*  BILITOT 0.8 0.7 0.7  PROT 7.3 7.3 8.0  ALBUMIN 3.3* 3.2* 3.2*   No results for input(s): LIPASE, AMYLASE in the last 168 hours. No results for input(s): AMMONIA in the last 168 hours. CBC: Recent Labs  Lab 04/10/21 1500 04/11/21 0540  WBC 5.3 5.2  HGB 13.2 13.6  HCT 38.7* 39.7  MCV 95.3 93.4  PLT 211 209   Cardiac Enzymes: No results for input(s): CKTOTAL, CKMB, CKMBINDEX, TROPONINI in the last 168 hours. BNP: Invalid input(s): POCBNP CBG: No results for input(s): GLUCAP in the last 168 hours. D-Dimer No results for input(s): DDIMER in the last 72 hours. Hgb A1c No results for input(s): HGBA1C in the last 72 hours. Lipid Profile No results for input(s): CHOL, HDL, LDLCALC, TRIG, CHOLHDL, LDLDIRECT in the last 72 hours. Thyroid function studies No results for input(s): TSH, T4TOTAL, T3FREE, THYROIDAB in the last 72 hours.  Invalid input(s): FREET3 Anemia work up [facility]    04/12/21 0529  FERRITIN 178   Urinalysis No results found for: COLORURINE, APPEARANCEUR, LABSPEC, PHURINE, GLUCOSEU, HGBUR, BILIRUBINUR, KETONESUR, PROTEINUR, UROBILINOGEN, NITRITE, LEUKOCYTESUR Sepsis Labs Invalid input(s): PROCALCITONIN,  WBC,  LACTICIDVEN Microbiology Recent Results (from the past 240 hour(s))  Resp Panel by RT-PCR (Flu A&B, Covid) Nasopharyngeal Swab     Status: Abnormal   Collection Time: 04/10/21  7:14 PM   Specimen: Nasopharyngeal Swab; Nasopharyngeal(NP) swabs in vial transport medium  Result Value Ref Range Status   SARS Coronavirus 2 by RT PCR POSITIVE (A) NEGATIVE Final    Comment: RESULT CALLED TO, READ BACK BY AND VERIFIED WITH: tia  bullock @2013  on 04/10/21 skl (NOTE) SARS-CoV-2 target nucleic acids are DETECTED.  The SARS-CoV-2 RNA is generally detectable in upper respiratory specimens during the acute phase of infection. Positive results are indicative of the presence of the identified virus, but do not rule out bacterial infection or co-infection with other pathogens not detected by the test. Clinical correlation with patient  history and other diagnostic information is necessary to determine patient infection status. The expected result is Negative.  Fact Sheet for Patients: BloggerCourse.com  Fact Sheet for Healthcare Providers: SeriousBroker.it  This test is not yet approved or cleared by the [place] FDA and  has been authorized for detection and/or diagnosis of SARS-CoV-2 by FDA under an Emergency Use Authorization (EUA).  This EUA will remain in effect (meaning this test can be  used) for the duration of  the COVID-19 declaration under Section 564(b)(1) of the Act, 21 U.S.C. section 360bbb-3(b)(1), unless the authorization is terminated or revoked sooner.     Influenza A by PCR NEGATIVE NEGATIVE Final   Influenza B by PCR NEGATIVE NEGATIVE Final    Comment: (NOTE) The Xpert Xpress SARS-CoV-2/FLU/RSV plus assay is intended as an aid in the diagnosis of influenza from Nasopharyngeal swab specimens and should not be used as a sole basis for treatment. Nasal washings and aspirates are unacceptable for Xpert Xpress SARS-CoV-2/FLU/RSV testing.  Fact Sheet for Patients: BloggerCourse.com  Fact Sheet for Healthcare Providers: SeriousBroker.it  This test is not yet approved or cleared by the [place] FDA and has been authorized for detection and/or diagnosis of SARS-CoV-2 by FDA under an Emergency Use Authorization (EUA). This EUA will remain in effect (meaning this test can be used) for the  duration of the COVID-19 declaration under Section 564(b)(1) of the Act, 21 U.S.C. section 360bbb-3(b)(1), unless the authorization is terminated or revoked.  Performed at [facility], [address] [place], [place] [address]   Body fluid culture w Gram Stain     Status: None   Collection Time: 04/11/21 10:34 AM   Specimen: PATH Cytology Pleural fluid  Result Value Ref Range Status   Specimen Description   Final    PLEURAL Performed at [facility], [address], [place], [place] [address]    Special Requests   Final    NONE Performed at [facility], [address] [place], [place] [address]    Gram Stain   Final    RARE WBC PRESENT, PREDOMINANTLY MONONUCLEAR NO ORGANISMS SEEN    Culture   Final    NO GROWTH 3 DAYS Performed at [facility], [address] [place], [place] [address]    Report Status 04/14/2021 FINAL  Final     Time coordinating discharge: 25 minutes The [place] controlled substances registry was reviewed for this patient  30 Day Unplanned Readmission Risk Score    Flowsheet Row ED to Hosp-Admission (Current) from 04/10/2021 in [facility] PCU  30 Day Unplanned Readmission Risk Score (%) 14.22 Filed at 04/13/2021 1200       This score is the patient's risk of an unplanned readmission within 30 days of being discharged (0 -100%). The score is based on dignosis, age, lab data, medications, orders, and past utilization.   Low:  0-14.9   Medium: 15-21.9   High: 22-29.9   Extreme: 30 and above            SIGNED:   Alberteen Sam, MD  [facility] 04/14/2021, 1:31 PM

## 2021-04-13 NOTE — Progress Notes (Signed)
[facility] PROGRESS NOTE    FOCH ROSENWALD  LGX:[id number] DOB: 15-Nov-1940 DOA: 04/10/2021 PCP: [name], MD      Brief Narrative:  Mr. Scull is a 80 y.o. M with COPD, CHF EF 25-30%, HTN who prsented with SOB, cough, leg swelling and orthpnea.  Patient started to develop symptoms about 1 week ago, this progressed to severe dyspnea with exertion and orthopnea so he came to the ER.  In the ER, chest x-ray showed bilateral opacities with effusions, COVID-positive, troponin minimally elevated and flat.  Started on Lasix and heparin infusion.          Assessment & Plan:  Acute on chronic systolic CHF Good UOP, symptoms better after thora.  No swelling.   Cr up slightly -Hold Lasix, spiro - Repeat BMP tomorrow - Strict intake and output   Bilateral pleural effusions Post L thora today, has small pneuothorax.  No symptoms.  Going to monitor overnight.  If chest pain, stat IR consult for chest tube.  If not chest pain, will plan for repeat CXR in AM and if stable or resolved, home.   COVID No new symptoms - Continue Paxlovid - Hold atorvastatin - Stop dexamethasone   COPD No evidence of flare - Continue home ICS/LABA/LAMA  Hypertension Coronary disease BP controlled - Continue metoprolol - Hold atorvastatin        Disposition: Status is: Inpatient  Remains inpatient appropriate because: Dyspnea resolved but has pneumothorax post procedure  Dispo: The patient is from: Home              Anticipated d/c is to: Home              Patient currently is not medically stable to d/c.   Difficult to place patient No       Level of care: Med-Surg       MDM: The below labs and imaging reports were reviewed and summarized above.  Medication management as above.     DVT prophylaxis: enoxaparin (LOVENOX) injection 40 mg Start: 04/12/21 1245 Place TED hose Start: 04/10/21 2030  Code Status: FULL Family Communication: daughter by  phone          Subjective: Orthopnea, swelling and dyspnea resolved.  No chest pain, no conrfusion, no fever, no cough  Objective: Vitals:   04/13/21 0324 04/13/21 0818 04/13/21 1222 04/13/21 1500  BP: 113/82 118/78 110/68 120/62  Pulse: 68 64 68   Resp:  18 17 17   Temp: (!) 97.4 F (36.3 C) (!) 97.5 F (36.4 C) 97.7 F (36.5 C) 98 F (36.7 C)  TempSrc: Oral  Oral Oral  SpO2: 100% 99% 99% 99%  Weight: 69.1 kg     Height:        Intake/Output Summary (Last 24 hours) at 04/13/2021 1757 Last data filed at 04/13/2021 1500 Gross per 24 hour  Intake 960 ml  Output 1200 ml  Net -240 ml   Filed Weights   04/10/21 1452 04/11/21 2036 04/13/21 0324  Weight: 68.9 kg 69.2 kg 69.1 kg    Examination: General appearance: Elderly adult male, lying in bed, no acute distress     HEENT:    Skin:  Cardiac: RRR, no murmurs, no lower extremity edema Respiratory: Normal respiratory rate and rhythm breath sounds normal, no rales or wheezes Abdomen: No tenderness palpation or guarding, no ascites or distention MSK:  Neuro: Awake and alert, extraocular movements intact, moves all extremities with normal strength and coordination, speech fluent Psych:  Sensorium intact responding questions, attention normal, affect normal, judgment insight appear normal     Data Reviewed: I have personally reviewed following labs and imaging studies:  CBC: Recent Labs  Lab 04/10/21 1500 04/11/21 0540  WBC 5.3 5.2  HGB 13.2 13.6  HCT 38.7* 39.7  MCV 95.3 93.4  PLT 211 209   Basic Metabolic Panel: Recent Labs  Lab 04/10/21 1500 04/11/21 0540 04/12/21 0529 04/13/21 0422  NA 137 138 137 136  K 4.0 3.8 4.3 4.4  CL 97* 96* 97* 93*  CO2 32 34* 31 37*  GLUCOSE 114* 98 126* 124*  BUN 28* 28* 31* 37*  CREATININE 1.09 0.99 1.08 1.25*  CALCIUM 8.8* 8.5* 8.9 8.9   GFR: Estimated Creatinine Clearance: 46.1 mL/min (A) (by C-G formula based on SCr of 1.25 mg/dL (H)). Liver Function Tests: Recent  Labs  Lab 04/11/21 0540 04/12/21 0529 04/13/21 0422  AST 77* 58* 73*  ALT 62* 56* 68*  ALKPHOS 129* 128* 132*  BILITOT 0.8 0.7 0.7  PROT 7.3 7.3 8.0  ALBUMIN 3.3* 3.2* 3.2*   No results for input(s): LIPASE, AMYLASE in the last 168 hours. No results for input(s): AMMONIA in the last 168 hours. Coagulation Profile: Recent Labs  Lab 04/10/21 2256  INR 1.1   Cardiac Enzymes: No results for input(s): CKTOTAL, CKMB, CKMBINDEX, TROPONINI in the last 168 hours. BNP (last 3 results) No results for input(s): PROBNP in the last 8760 hours. HbA1C: No results for input(s): HGBA1C in the last 72 hours. CBG: No results for input(s): GLUCAP in the last 168 hours. Lipid Profile: No results for input(s): CHOL, HDL, LDLCALC, TRIG, CHOLHDL, LDLDIRECT in the last 72 hours. Thyroid Function Tests: No results for input(s): TSH, T4TOTAL, FREET4, T3FREE, THYROIDAB in the last 72 hours. Anemia Panel: Recent Labs    04/11/21 0540 04/12/21 0529  FERRITIN 184 178   Urine analysis: No results found for: COLORURINE, APPEARANCEUR, LABSPEC, PHURINE, GLUCOSEU, HGBUR, BILIRUBINUR, KETONESUR, PROTEINUR, UROBILINOGEN, NITRITE, LEUKOCYTESUR Sepsis Labs: @LABRCNTIP (procalcitonin:4,lacticacidven:4)  ) Recent Results (from the past 240 hour(s))  Resp Panel by RT-PCR (Flu A&B, Covid) Nasopharyngeal Swab     Status: Abnormal   Collection Time: 04/10/21  7:14 PM   Specimen: Nasopharyngeal Swab; Nasopharyngeal(NP) swabs in vial transport medium  Result Value Ref Range Status   SARS Coronavirus 2 by RT PCR POSITIVE (A) NEGATIVE Final    Comment: RESULT CALLED TO, READ BACK BY AND VERIFIED WITH: tia bullock @2013  on 04/10/21 skl (NOTE) SARS-CoV-2 target nucleic acids are DETECTED.  The SARS-CoV-2 RNA is generally detectable in upper respiratory specimens during the acute phase of infection. Positive results are indicative of the presence of the identified virus, but do not rule out bacterial infection or  co-infection with other pathogens not detected by the test. Clinical correlation with patient history and other diagnostic information is necessary to determine patient infection status. The expected result is Negative.  Fact Sheet for Patients:  Fact Sheet for Healthcare Providers: 04/12/21  This test is not yet approved or cleared by the [employer] FDA and  has been authorized for detection and/or diagnosis of SARS-CoV-2 by FDA under an Emergency Use Authorization (EUA).  This EUA will remain in effect (meaning this test can be  used) for the duration of  the COVID-19 declaration under Section 564(b)(1) of the Act, 21 U.S.C. section 360bbb-3(b)(1), unless the authorization is terminated or revoked sooner.     Influenza A by PCR NEGATIVE NEGATIVE Final   Influenza B  by PCR NEGATIVE NEGATIVE Final    Comment: (NOTE) The Xpert Xpress SARS-CoV-2/FLU/RSV plus assay is intended as an aid in the diagnosis of influenza from Nasopharyngeal swab specimens and should not be used as a sole basis for treatment. Nasal washings and aspirates are unacceptable for Xpert Xpress SARS-CoV-2/FLU/RSV testing.  Fact Sheet for Patients: [employer]com  Fact Sheet for Healthcare Providers: SeriousBroker.it  This test is not yet approved or cleared by the [place] FDA and has been authorized for detection and/or diagnosis of SARS-CoV-2 by FDA under an Emergency Use Authorization (EUA). This EUA will remain in effect (meaning this test can be used) for the duration of the COVID-19 declaration under Section 564(b)(1) of the Act, 21 U.S.C. section 360bbb-3(b)(1), unless the authorization is terminated or revoked.  Performed at [facility], [address] [place], [place] [address]   Body fluid culture w Gram Stain     Status: None (Preliminary result)    Collection Time: 04/11/21 10:34 AM   Specimen: PATH Cytology Pleural fluid  Result Value Ref Range Status   Specimen Description   Final    PLEURAL Performed at [facility], [address] [place], [place] [address]    Special Requests   Final    NONE Performed at [facility], [address] [place], [place] [address]    Gram Stain   Final    RARE WBC PRESENT, PREDOMINANTLY MONONUCLEAR NO ORGANISMS SEEN    Culture   Final    NO GROWTH 2 DAYS Performed at [facility], [address] [place], [place] [address]    Report Status PENDING  Incomplete         Radiology Studies: DG Chest Port 1 View  Result Date: 04/13/2021 CLINICAL DATA:  Post thoracentesis. EXAM: PORTABLE CHEST 1 VIEW COMPARISON:  Chest radiograph, 04/11/2021.  CT chest, 11/20/2020. FINDINGS: Support lines: None Cardiomediastinal silhouette is moderately enlarged. The LEFT lung is clear. Trace LEFT apical pneumothorax. Small-to-moderate volume RIGHT pleural effusion. No interval osseous abnormality. IMPRESSION: 1. Trace LEFT apical pneumothorax. 2. Small-to-moderate volume RIGHT pleural effusion. These results were called by telephone at the time of interpretation on 04/13/2021 at 5:47 pm to provider who verbally acknowledged these results. Electronically Signed   By: Roanna Banning M.D.   On: 04/13/2021 17:49        Scheduled Meds:  dexamethasone (DECADRON) injection  6 mg Intravenous Daily   enoxaparin (LOVENOX) injection  40 mg Subcutaneous Q24H   feeding supplement  237 mL Oral TID BM   fluticasone furoate-vilanterol  1 puff Inhalation QPM   And   umeclidinium bromide  1 puff Inhalation QPM   metoprolol succinate  25 mg Oral Daily   multivitamin with minerals  1 tablet Oral Daily   nirmatrelvir/ritonavir EUA  3 tablet Oral BID   nystatin  5 mL Mouth/Throat QID   Continuous Infusions:     LOS: 2 days    Time spent: 25 minutes    Alberteen Sam, MD [facility] 04/13/2021, 5:57 PM     Please page though AMION or Epic secure chat:  For [employer], Higher education careers adviser

## 2021-04-13 NOTE — Procedures (Signed)
Vascular and Interventional Radiology Procedure Note  Patient: Todd Perez DOB: 1941-07-30 Medical Record Number: [id number] Note Date/Time: 04/13/21 5:16 PM   Performing Physician: Roanna Banning, MD Assistant(s): None  Diagnosis: Pleural effusion  Procedure: THORACENTESIS  Anesthesia: Local Anesthetic Complications: None Estimated Blood Loss: Minimal Specimens:  Sent None  Findings:  The Left chest was accessed with a  safe-T-centesis needle , and ~1.1 L of fluid was obtained.  See detailed procedure note with images in PACS. The patient tolerated the procedure well without incident or complication and was returned to Floor Bed in stable condition.    Roanna Banning, MD [facility] Radiology   Pager. [phone] Clinic. [phone]

## 2021-04-13 NOTE — Progress Notes (Signed)
[facility] Cardiology  SUBJECTIVE: Patient laying in bed, reports doing well, denies chest pain or shortness of breath, wishes to go home   Vitals:   04/12/21 1600 04/12/21 2038 04/12/21 2330 04/13/21 0324  BP: 114/77 104/73 107/80 113/82  Pulse: 72  69 68  Resp: 16 18    Temp: 98 F (36.7 C) 98 F (36.7 C) (!) 97.5 F (36.4 C) (!) 97.4 F (36.3 C)  TempSrc:  Oral Oral Oral  SpO2: 100% 99% 98% 100%  Weight:    69.1 kg  Height:         Intake/Output Summary (Last 24 hours) at 04/13/2021 0817 Last data filed at 04/13/2021 0326 Gross per 24 hour  Intake 240 ml  Output 1800 ml  Net -1560 ml      PHYSICAL EXAM  General: Well developed, well nourished, in no acute distress HEENT:  Normocephalic and atramatic Neck:  No JVD.  Lungs: Clear bilaterally to auscultation and percussion. Heart: HRRR . Normal S1 and S2 without gallops or murmurs.  Abdomen: Bowel sounds are positive, abdomen soft and non-tender  Msk:  Back normal, normal gait. Normal strength and tone for age. Extremities: No clubbing, cyanosis or edema.   Neuro: Alert and oriented X 3. Psych:  Good affect, responds appropriately   LABS: Basic Metabolic Panel: Recent Labs    04/12/21 0529 04/13/21 0422  NA 137 136  K 4.3 4.4  CL 97* 93*  CO2 31 37*  GLUCOSE 126* 124*  BUN 31* 37*  CREATININE 1.08 1.25*  CALCIUM 8.9 8.9   Liver Function Tests: Recent Labs    04/12/21 0529 04/13/21 0422  AST 58* 73*  ALT 56* 68*  ALKPHOS 128* 132*  BILITOT 0.7 0.7  PROT 7.3 8.0  ALBUMIN 3.2* 3.2*   No results for input(s): LIPASE, AMYLASE in the last 72 hours. CBC: Recent Labs    04/10/21 1500 04/11/21 0540  WBC 5.3 5.2  HGB 13.2 13.6  HCT 38.7* 39.7  MCV 95.3 93.4  PLT 211 209   Cardiac Enzymes: No results for input(s): CKTOTAL, CKMB, CKMBINDEX, TROPONINI in the last 72 hours. BNP: Invalid input(s): POCBNP D-Dimer: No results for input(s): DDIMER in the last 72 hours. Hemoglobin A1C: No results for  input(s): HGBA1C in the last 72 hours. Fasting Lipid Panel: No results for input(s): CHOL, HDL, LDLCALC, TRIG, CHOLHDL, LDLDIRECT in the last 72 hours. Thyroid Function Tests: No results for input(s): TSH, T4TOTAL, T3FREE, THYROIDAB in the last 72 hours.  Invalid input(s): FREET3 Anemia Panel: Recent Labs    04/12/21 0529  FERRITIN 178    DG Chest Port 1 View  Result Date: 04/11/2021 CLINICAL DATA:  Post thoracentesis EXAM: PORTABLE CHEST 1 VIEW COMPARISON:  Radiograph 04/10/2021 FINDINGS: Unchanged cardiomediastinal silhouette. Unchanged diffuse interstitial opacities. Persistent bilateral pleural effusions, decreased on the right in comparison to prior exam. There is no visible pneumothorax. No acute osseous abnormality. IMPRESSION: Persistent bilateral pleural effusions, decreased on the right in comparison prior exam. No visible pneumothorax. Electronically Signed   By: Caprice Renshaw M.D.   On: 04/11/2021 11:22   US THORACENTESIS ASP PLEURAL SPACE W/IMG GUIDE  Result Date: 04/11/2021 INDICATION: Bilateral pleural effusions, request received for diagnostic and therapeutic thoracentesis. EXAM: ULTRASOUND GUIDED RIGHT THORACENTESIS MEDICATIONS: 1% lidocaine only. COMPLICATIONS: None immediate. PROCEDURE: An ultrasound guided thoracentesis was thoroughly discussed with the patient and questions answered. The benefits, risks, alternatives and complications were also discussed. The patient understands and wishes to proceed with the procedure. Written consent  was obtained. Ultrasound was performed to localize and mark an adequate pocket of fluid in the right chest. The area was then prepped and draped in the normal sterile fashion. 1% Lidocaine was used for local anesthesia. Under ultrasound guidance a 19 gauge, 7-cm, Yueh catheter was introduced. Thoracentesis was performed. The catheter was removed and a dressing applied. FINDINGS: A total of approximately 1000 mL of clear yellow fluid was removed.  Samples were sent to the laboratory as requested by the clinical team. IMPRESSION: Successful ultrasound guided right thoracentesis yielding 1 L of pleural fluid. Read By: Pattricia Boss PA-C Electronically Signed   By: Acquanetta Belling M.D.   On: 04/11/2021 10:53     Echo   TELEMETRY: Sinus rhythm:  ASSESSMENT AND PLAN:  Principal Problem:   Shortness of breath Active Problems:   Acute on chronic systolic CHF (congestive heart failure) (HCC)   CAD (coronary artery disease)   Ischemic cardiomyopathy   Hypertension   Elevated troponin   NSTEMI (non-ST elevated myocardial infarction) (HCC)   Pleural effusion due to CHF (congestive heart failure) (HCC)   COVID-19 virus infection   Thrush   Acute on chronic systolic (congestive) heart failure (HCC)   Protein-calorie malnutrition, severe    1.  Elevated troponin (310, 318, 284, 336) in the absence of chest pain, in the setting of acute on chronic systolic congestive heart failure and COVID-19 pneumonia, very likely demand supply ischemia, not due to acute coronary syndrome. 2.  Coronary artery disease, mild, with cardiac catheterization 07/10/2018 revealing occluded RV marginal branch 3.  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  Acute on chronic systolic congestive heart failure, treated with IV furosemide 5.  Bilateral pleural effusions, that is post right thoracentesis 04/11/2021, left thoracentesis pending 6.  COVID-19 pneumonia, treated with IV remdesivir and Solu-Medrol   Recommendations   1.  Agree with current therapy 2.  Diuresis as needed 3.  Carefully monitor renal status 4.  Await left thoracentesis 5.  Consider discharge home after left thoracentesis 6.  Follow-up with Dr. Lady Gary in 1 to 2 weeks  Sign off for now, please call if any questions   Marcina Millard, MD, PhD, [facility] 04/13/2021 8:17 AM

## 2021-04-14 ENCOUNTER — Inpatient Hospital Stay: Payer: Medicare Other

## 2021-04-14 LAB — C-REACTIVE PROTEIN: CRP: 0.7 mg/dL (ref <1.0)

## 2021-04-14 LAB — BASIC METABOLIC PANEL
Anion gap: 6 (ref 5–15)
BUN: 38 mg/dL — ABNORMAL HIGH (ref 8–23)
CO2: 36 mmol/L — ABNORMAL HIGH (ref 22–32)
Calcium: 8.9 mg/dL (ref 8.9–10.3)
Chloride: 97 mmol/L — ABNORMAL LOW (ref 98–111)
Creatinine, Ser: 1.03 mg/dL (ref 0.61–1.24)
GFR, Estimated: >60 mL/min (ref >60)
Glucose, Bld: 139 mg/dL — ABNORMAL HIGH (ref 70–99)
Potassium: 4.3 mmol/L (ref 3.5–5.1)
Sodium: 139 mmol/L (ref 135–145)

## 2021-04-14 LAB — BODY FLUID CULTURE W GRAM STAIN: Culture: NO GROWTH

## 2021-04-14 MED ORDER — FUROSEMIDE 20 MG PO TABS
20.0000 mg | ORAL_TABLET | Freq: Two times a day (BID) | ORAL | Status: DC
  Administered 2021-04-14: 20 mg via ORAL
  Filled 2021-04-14: qty 1

## 2021-04-14 MED ORDER — SPIRONOLACTONE 25 MG PO TABS
25.0000 mg | ORAL_TABLET | Freq: Every day | ORAL | Status: DC
  Administered 2021-04-14: 25 mg via ORAL
  Filled 2021-04-14: qty 1

## 2021-04-14 MED ORDER — NIRMATRELVIR/RITONAVIR (PAXLOVID)TABLET: 3.0000 | ORAL_TABLET | Freq: Two times a day (BID) | ORAL | 0 refills | Status: AC

## 2021-04-14 MED ORDER — METOPROLOL SUCCINATE ER 25 MG PO TB24: 25.0000 mg | ORAL_TABLET | Freq: Every day | ORAL | Status: AC

## 2021-04-14 MED ORDER — ENSURE ENLIVE PO LIQD: 237.0000 mL | Freq: Three times a day (TID) | ORAL | 12 refills | Status: AC

## 2021-04-18 ENCOUNTER — Emergency Department: Payer: Medicare Other

## 2021-04-18 ENCOUNTER — Other Ambulatory Visit: Payer: Self-pay

## 2021-04-18 ENCOUNTER — Emergency Department
Admission: EM | Admit: 2021-04-18 | Discharge: 2021-04-18 | Disposition: A | Discharge status: Home / Self Care | Payer: Medicare Other | Attending: Emergency Medicine | Admitting: Emergency Medicine

## 2021-04-18 ENCOUNTER — Encounter: Payer: Self-pay | Admitting: Emergency Medicine

## 2021-04-18 DIAGNOSIS — I110 Hypertensive heart disease with heart failure: Secondary | ICD-10-CM | POA: Insufficient documentation

## 2021-04-18 DIAGNOSIS — Z79899 Other long term (current) drug therapy: Secondary | ICD-10-CM | POA: Diagnosis not present

## 2021-04-18 DIAGNOSIS — Z8616 Personal history of COVID-19: Secondary | ICD-10-CM | POA: Diagnosis not present

## 2021-04-18 DIAGNOSIS — I5023 Acute on chronic systolic (congestive) heart failure: Secondary | ICD-10-CM | POA: Insufficient documentation

## 2021-04-18 DIAGNOSIS — J449 Chronic obstructive pulmonary disease, unspecified: Secondary | ICD-10-CM

## 2021-04-18 DIAGNOSIS — R042 Hemoptysis: Secondary | ICD-10-CM | POA: Insufficient documentation

## 2021-04-18 DIAGNOSIS — J90 Pleural effusion, not elsewhere classified: Secondary | ICD-10-CM | POA: Diagnosis not present

## 2021-04-18 DIAGNOSIS — Z7982 Long term (current) use of aspirin: Secondary | ICD-10-CM | POA: Diagnosis not present

## 2021-04-18 DIAGNOSIS — I2510 Atherosclerotic heart disease of native coronary artery without angina pectoris: Secondary | ICD-10-CM | POA: Diagnosis not present

## 2021-04-18 DIAGNOSIS — Z87891 Personal history of nicotine dependence: Secondary | ICD-10-CM | POA: Insufficient documentation

## 2021-04-18 LAB — CBC WITH DIFFERENTIAL/PLATELET
Abs Immature Granulocytes: 0.04 10*3/uL (ref 0.00–0.07)
Basophils Absolute: 0 10*3/uL (ref 0.0–0.1)
Basophils Relative: 0 %
Eosinophils Absolute: 0 10*3/uL (ref 0.0–0.5)
Eosinophils Relative: 0 %
HCT: 42.8 % (ref 39.0–52.0)
Hemoglobin: 14.8 g/dL (ref 13.0–17.0)
Immature Granulocytes: 0 %
Lymphocytes Relative: 8 %
Lymphs Abs: 1.1 10*3/uL (ref 0.7–4.0)
MCH: 32.2 pg (ref 26.0–34.0)
MCHC: 34.6 g/dL (ref 30.0–36.0)
MCV: 93 fL (ref 80.0–100.0)
Monocytes Absolute: 0.8 10*3/uL (ref 0.1–1.0)
Monocytes Relative: 6 %
Neutro Abs: 11.3 10*3/uL — ABNORMAL HIGH (ref 1.7–7.7)
Neutrophils Relative %: 86 %
Platelets: 249 10*3/uL (ref 150–400)
RBC: 4.6 MIL/uL (ref 4.22–5.81)
RDW: 14.6 % (ref 11.5–15.5)
WBC: 13.2 10*3/uL — ABNORMAL HIGH (ref 4.0–10.5)
nRBC: 0 % (ref 0.0–0.2)

## 2021-04-18 LAB — BASIC METABOLIC PANEL
Anion gap: 9 (ref 5–15)
BUN: 32 mg/dL — ABNORMAL HIGH (ref 8–23)
CO2: 36 mmol/L — ABNORMAL HIGH (ref 22–32)
Calcium: 9.2 mg/dL (ref 8.9–10.3)
Chloride: 95 mmol/L — ABNORMAL LOW (ref 98–111)
Creatinine, Ser: 0.94 mg/dL (ref 0.61–1.24)
GFR, Estimated: >60 mL/min (ref >60)
Glucose, Bld: 109 mg/dL — ABNORMAL HIGH (ref 70–99)
Potassium: 4.7 mmol/L (ref 3.5–5.1)
Sodium: 140 mmol/L (ref 135–145)

## 2021-04-18 MED ORDER — AMOXICILLIN-POT CLAVULANATE 875-125 MG PO TABS: 1.0000 | ORAL_TABLET | Freq: Two times a day (BID) | ORAL | 0 refills | Status: DC

## 2021-04-18 MED ORDER — GUAIFENESIN ER 600 MG PO TB12: 600.0000 mg | ORAL_TABLET | Freq: Two times a day (BID) | ORAL | 0 refills | Status: AC

## 2021-04-18 NOTE — ED Provider Notes (Signed)
[facility] Emergency Department Provider Note  ____________________________________________  Time seen: Approximately 9:54 AM  I have reviewed the triage vital signs and the nursing notes.   HISTORY  Chief Complaint Shortness of Breath    HPI Todd Perez is a 80 y.o. male with a history of CAD CHF hypertension, recent COVID and hospitalization for bilateral pleural effusions who comes ED complaining of hemoptysis that started yesterday.  He was just discharged from the hospital 4 days ago.  He denies any shortness of breath above his baseline.  Denies any chest pain or pleuritic symptoms.  Denies fever weakness or dyspnea on exertion.  No orthopnea.  No vomiting, tolerating oral intake okay.  Just come in to get checked out for hemoptysis.  Symptoms are intermittent, no aggravating or alleviating factors.    Past Medical History:  Diagnosis Date   CAD (coronary artery disease)    cath in Nov 2019   CHF (congestive heart failure) (HCC)    Hyperlipidemia    Hypertension    Ischemic cardiomyopathy    EF 40%   Neuromuscular disorder (HCC)    Peripheral neuropathy      Patient Active Problem List   Diagnosis Date Noted   Protein-calorie malnutrition, severe 04/12/2021   Acute on chronic systolic (congestive) heart failure (HCC) 04/11/2021   NSTEMI (non-ST elevated myocardial infarction) (HCC) 04/10/2021   Pleural effusion due to CHF (congestive heart failure) (HCC) 04/10/2021   Shortness of breath 04/10/2021   COVID-19 virus infection 04/10/2021   Thrush 04/10/2021   Acute on chronic systolic CHF (congestive heart failure) (HCC) 11/23/2020   CAD (coronary artery disease)    Ischemic cardiomyopathy    Hypertension    Elevated troponin    Pneumonia 08/25/2018   CHF (congestive heart failure) (HCC) 04/18/2018     Past Surgical History:  Procedure Laterality Date   COLONOSCOPY WITH PROPOFOL N/A 02/06/2015   Procedure: COLONOSCOPY WITH  PROPOFOL;  Surgeon: Wallace Cullens, MD;  Location: [facility];  Service: Gastroenterology;  Laterality: N/A;   LEFT HEART CATH AND CORONARY ANGIOGRAPHY Left 07/08/2018   Procedure: LEFT HEART CATH AND CORONARY ANGIOGRAPHY;  Surgeon: Dalia Heading, MD;  Location: [facility] INVASIVE CV LAB;  Service: Cardiovascular;  Laterality: Left;   thyroid nodule removal       Prior to Admission medications   Medication Sig Start Date End Date Taking? Authorizing Provider  amoxicillin-clavulanate (AUGMENTIN) 875-125 MG tablet Take 1 tablet by mouth 2 (two) times daily. 04/18/21  Yes Sharman Cheek, MD  guaiFENesin (MUCINEX) 600 MG 12 hr tablet Take 1 tablet (600 mg total) by mouth 2 (two) times daily for 15 days. 04/18/21 05/03/21 Yes Sharman Cheek, MD  albuterol (VENTOLIN HFA) 108 (90 Base) MCG/ACT inhaler Inhale 2 puffs into the lungs every 4 (four) hours as needed. 03/26/21   [provider]  allopurinol (ZYLOPRIM) 100 MG tablet Take 100 mg by mouth daily as needed (gout prevention).     [provider]  aspirin 81 MG chewable tablet Chew 1 tablet (81 mg total) by mouth daily. 11/26/20   Zannie Cove, MD  feeding supplement (ENSURE ENLIVE / ENSURE PLUS) LIQD Take 237 mLs by mouth 3 (three) times daily between meals. 04/14/21   Danford, Earl Lites, MD  furosemide (LASIX) 20 MG tablet Take 20 mg by mouth 2 (two) times daily. 01/25/21   [provider]  metoprolol succinate (TOPROL-XL) 25 MG 24 hr tablet Take 1 tablet (25 mg total) by mouth daily.  04/14/21   Danford, Earl Lites, MD  potassium chloride 20 MEQ TBCR Take 20 mEq by mouth daily. 11/25/20   Zannie Cove, MD  SPIRIVA RESPIMAT 2.5 MCG/ACT AERS Inhale 2 puffs into the lungs daily. 04/02/21   [provider]  spironolactone (ALDACTONE) 25 MG tablet Take 25 mg by mouth daily. 02/15/21   [provider]  TRELEGY ELLIPTA 100-62.5-25 MCG/INH AEPB Inhale 1 puff into the lungs daily. 10/10/20   [provider]     Allergies Benicar hct [olmesartan medoxomil-hctz] and Lisinopril   Family History  Problem Relation Age of Onset   Kidney disease Mother    Emphysema Father    Lung cancer Father     Social History Social History   Tobacco Use   Smoking status: Former    Packs/day: 0.25    Years: 20.00    Pack years: 5.00    Types: Cigarettes   Smokeless tobacco: Never  Vaping Use   Vaping Use: Never used  Substance Use Topics   Alcohol use: Not Currently    Review of Systems  Constitutional:   No fever or chills.  ENT:   No sore throat. No rhinorrhea. Cardiovascular:   No chest pain or syncope. Respiratory:   No dyspnea positive cough. Gastrointestinal:   Negative for abdominal pain, vomiting and diarrhea.  Musculoskeletal:   Negative for focal pain or swelling All other systems reviewed and are negative except as documented above in ROS and HPI.  ____________________________________________   PHYSICAL EXAM:  VITAL SIGNS: ED Triage Vitals  Enc Vitals Group     BP 04/18/21 0631 119/83     Pulse Rate 04/18/21 0631 93     Resp 04/18/21 0631 20     Temp 04/18/21 0733 98.9 F (37.2 C)     Temp Source 04/18/21 0631 Oral     SpO2 04/18/21 0631 97 %     Weight 04/18/21 0631 138 lb (62.6 kg)     Height 04/18/21 0631 6\' 1"  (1.854 m)     Head Circumference --      Peak Flow --      Pain Score 04/18/21 0630 0     Pain Loc --      Pain Edu? --      Excl. in GC? --     Vital signs reviewed, nursing assessments reviewed.   Constitutional:   Alert and oriented. Non-toxic appearance. Eyes:   Conjunctivae are normal. EOMI. PERRL. ENT      Head:   Normocephalic and atraumatic.      Nose:   Wearing a mask.      Mouth/Throat:   Wearing a mask.      Neck:   No meningismus. Full ROM. Hematological/Lymphatic/Immunilogical:   No cervical lymphadenopathy. Cardiovascular:   RRR. Symmetric bilateral radial and DP pulses.  No murmurs. Cap refill less than 2  seconds. Respiratory:   Normal respiratory effort without tachypnea/retractions.  Diminished breath sounds at left base  gastrointestinal:   Soft and nontender. Non distended. There is no CVA tenderness.  No rebound, rigidity, or guarding. Genitourinary:   deferred Musculoskeletal:   Normal range of motion in all extremities. No joint effusions.  No lower extremity tenderness.  No edema. Neurologic:   Normal speech and language.  Motor grossly intact. No acute focal neurologic deficits are appreciated.  Skin:    Skin is warm, dry and intact. No rash noted.  No petechiae, purpura, or bullae.  ____________________________________________    LABS (pertinent  positives/negatives) (all labs ordered are listed, but only abnormal results are displayed) Labs Reviewed  BASIC METABOLIC PANEL - Abnormal; Notable for the following components:      Result Value   Chloride 95 (*)    CO2 36 (*)    Glucose, Bld 109 (*)    BUN 32 (*)    All other components within normal limits  CBC WITH DIFFERENTIAL/PLATELET - Abnormal; Notable for the following components:   WBC 13.2 (*)    Neutro Abs 11.3 (*)    All other components within normal limits   ____________________________________________   EKG  Interpreted by me Sinus rhythm rate of 92, left axis, right bundle branch block.  Normal QRS and ST segments.  No acute ischemic changes.  ____________________________________________    RADIOLOGY  CT Chest Wo Contrast  Result Date: 04/18/2021 CLINICAL DATA:  Respiratory illness, nondiagnostic xray. Hemoptysis, shortness of breath. History of CHF and COVID-19. Left thoracentesis on 04/13/2021. History of left pneumothorax. EXAM: CT CHEST WITHOUT CONTRAST TECHNIQUE: Multidetector CT imaging of the chest was performed following the standard protocol without IV contrast. COMPARISON:  CT 11/20/2020 FINDINGS: Cardiovascular: Moderate cardiomegaly. Trace pericardial effusion. Stable ascending thoracic aortic  aneurysm measuring approximately 4.2 cm in diameter. Atherosclerotic calcifications of the aorta and coronary arteries. Main pulmonary trunk measures 3.5 cm in diameter. Mediastinum/Nodes: Stable mildly prominent precarinal lymph node. No axillary lymphadenopathy. Evaluation of the hilar structures is limited in the absence of intravenous contrast. Within this limitation, no obvious hilar adenopathy or mass is identified. No acute abnormality is identified involving the thyroid, trachea, or esophagus. Lungs/Pleura: Moderate right and small left pleural effusions with associated compressive atelectasis. Patchy consolidations within the left lower lobe and lingula as well as within the right lower lobe. Patchy areas of predominantly ground-glass opacities within the bilateral upper lobes. Trace left apical pneumothorax. No right-sided pneumothorax. Upper Abdomen: Ovoid calcifications within the upper pole of the left kidney measuring up to 1.2 cm in diameter. No acute findings within the included upper abdomen. Musculoskeletal: No chest wall mass or suspicious bone lesions identified. IMPRESSION: 1. Persistent trace left apical pneumothorax. 2. Moderate right and small left pleural effusions with associated compressive atelectasis. 3. Patchy consolidations within the left lower lobe and lingula as well as within the right lower lobe. Patchy areas of predominantly ground-glass opacities within the bilateral upper lobes. Findings may represent pulmonary edema versus multifocal pneumonia. 4. Moderate cardiomegaly with trace pericardial effusion. 5. Dilated main pulmonary trunk suggesting pulmonary arterial hypertension. 6. 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 Recommend annual imaging followup by CTA or MRA. This recommendation follows 2010 ACCF/AHA/AATS/ACR/ASA/SCA/SCAI/SIR/STS/SVM Guidelines for the Diagnosis and Management of Patients with Thoracic Aortic Disease. Circulation.  2010; 121: G269-S854. Aortic aneurysm NOS (ICD10-I71.9) Aortic Atherosclerosis (ICD10-I70.0). Electronically Signed   By: Duanne Guess D.O.   On: 04/18/2021 09:12   DG Chest Portable 1 View  Result Date: 04/18/2021 CLINICAL DATA:  80 year old male with shortness of breath and hemoptysis. Recently hospitalized for CHF and COVID. EXAM: PORTABLE CHEST 1 VIEW COMPARISON:  Portable chest 04/14/2021 and earlier. FINDINGS: Portable AP semi upright view at 0651 hours. Possible trace residual left pneumothorax. Skin fold artifacts also noted at the chest. Stable lung volumes. Mixed layering and loculated left and layering right pleural effusions with coarse and patchy bilateral interstitial and basilar opacity. Stable cardiac size and mediastinal contours. Visualized tracheal air column is within normal limits. Ventilation in the left lung has decreased since 04/13/2021.  The right lung is stable. No acute osseous abnormality identified. Negative visible bowel gas pattern. IMPRESSION: 1. Possible trace continued left pneumothorax. 2. Bilateral pleural effusions, partially loculated on the left, with patchy and interstitial bilateral pulmonary opacity. Worsening left lung ventilation since 04/13/2021, right lung is stable. Electronically Signed   By: Odessa Fleming M.D.   On: 04/18/2021 07:10    ____________________________________________   PROCEDURES Procedures  ____________________________________________  DIFFERENTIAL DIAGNOSIS   Pulmonary edema, pleural effusion, pneumonia, lung injury from pleural effusion, pericardial effusion  CLINICAL IMPRESSION / ASSESSMENT AND PLAN / ED COURSE  Medications ordered in the ED: Medications - No data to display  Pertinent labs & imaging results that were available during my care of the patient were reviewed by me and considered in my medical decision making (see chart for details).  Todd Perez was evaluated in Emergency Department on 04/18/2021 for the symptoms  described in the history of present illness. He was evaluated in the context of the global COVID-19 pandemic, which necessitated consideration that the patient might be at risk for infection with the SARS-CoV-2 virus that causes COVID-19. Institutional protocols and algorithms that pertain to the evaluation of patients at risk for COVID-19 are in a state of rapid change based on information released by regulatory bodies including the CDC and federal and state organizations. These policies and algorithms were followed during the patient's care in the ED.   Patient presents with hemoptysis.  No chest pain or acute shortness of breath.  Vital signs are normal, oxygenation is normal on his usual nasal cannula.  Highly doubt ACS PE or dissection.  Labs and chest x-ray overall unremarkable.  CT of the chest shows some possible infiltrate.  Patient's not septic, he is well-appearing and overall feeling okay.  I think he is suitable for home trial of Augmentin and follow-up with pulmonology for his ongoing issues.      ____________________________________________   FINAL CLINICAL IMPRESSION(S) / ED DIAGNOSES    Final diagnoses:  Cough with hemoptysis  Pleural effusion  Chronic obstructive pulmonary disease, unspecified COPD type [facility])     ED Discharge Orders          Ordered    amoxicillin-clavulanate (AUGMENTIN) 875-125 MG tablet  2 times daily        04/18/21 0951    guaiFENesin (MUCINEX) 600 MG 12 hr tablet  2 times daily        04/18/21 0951            Portions of this note were generated with dragon dictation software. Dictation errors may occur despite best attempts at proofreading.    Sharman Cheek, MD 04/18/21 [phone]

## 2021-04-18 NOTE — ED Notes (Signed)
See triage note, pt reports shob, alert and oriented. Hooked up to cardiac monitoring. Call bell in reach. Pt speaking in short sentences

## 2021-04-18 NOTE — ED Notes (Signed)
Pt assisted to restroom.  

## 2021-04-18 NOTE — ED Triage Notes (Signed)
Pt to triage via w/c with no distress noted; pt reports hemoptysis and SHOB; recently d/c from hosp for CHF and COVID and had thoracentesis

## 2021-04-18 NOTE — ED Notes (Signed)
Pt provided ginger ale as requested.

## 2021-04-18 NOTE — Discharge Instructions (Addendum)
Your imaging shows possible pneumonia developing in your lung.  There is a small amount of remaining pleural effusion fluid as well.  Please take Augmentin as prescribed and continue your other home medications to help ensure you get back to normal.  Please follow-up with your doctor and with pulmonology for further monitoring of your symptoms.

## 2021-04-24 NOTE — Progress Notes (Deleted)
   Patient ID: PHELAN SCHADT, male    DOB: March 29, 1941, 80 y.o.   MRN: [id number]  HPI  Mr Hiltz is a 80 y/o male with a history of  Echo report from 11/23/20 reviewed and showed an EF of 25-30% along with moderate MR/TR.   LHC done 07/08/18 showed:  Acute Mrg lesion is 100% stenosed. Prox Cx lesion is 40% stenosed.  LM-normal LAD-large vessel with no disease LCx-codominant with 40% proximal RCA-codominent with occluded rv margional filled by collaterals from left.  EF 35-40% by echo and myoview.   Was in the ED 04/18/21 due to cough and hemoptysis. Evaluated and released. Admitted 04/10/21 due to shortness of breath and orthopnea. Lasix and heparin drips started. Cardiology consult obtained. Tested covid +. Had bilateral thoracentesis with removal of 1L each side. Transitioned to oral lasix. Discharged after 4 days.   He presents today for his initial visit with a chief complaint of   Review of Systems    Physical Exam  Assessment & Plan:  1: Chronic heart failure with reduced ejection fraction- - NYHA class - saw cardiology (Fath) 03/19/21 - BNP 04/10/21 was 1127.9  2: HTN- - BP - saw PCP(Hedrick) 01/31/21 - BMP 04/18/21 reviewed and showed sodium 140, potassium 4.7, creatinine 0.94 and GFR >60  3: Pulmonary HTN- - saw pulmonology Karna Christmas) 03/22/21

## 2021-04-25 ENCOUNTER — Telehealth: Payer: Self-pay | Admitting: Family

## 2021-04-25 ENCOUNTER — Ambulatory Visit: Payer: Medicare Other | Admitting: Family

## 2021-04-25 NOTE — Telephone Encounter (Signed)
Patient did not show for his [facility] appointment on 04/25/21. Will attempt to reschedule.   

## 2021-05-04 ENCOUNTER — Other Ambulatory Visit: Payer: Self-pay | Admitting: Pulmonary Disease

## 2021-05-16 ENCOUNTER — Emergency Department: Payer: Medicare Other

## 2021-05-16 ENCOUNTER — Inpatient Hospital Stay
Admit: 2021-05-16 | Discharge: 2021-05-16 | Disposition: A | Discharge status: Home / Self Care | Payer: Medicare Other | Attending: Internal Medicine | Admitting: Internal Medicine

## 2021-05-16 ENCOUNTER — Inpatient Hospital Stay
Admission: EM | Admit: 2021-05-16 | Discharge: 2021-05-17 | DRG: 291 | Disposition: A | Discharge status: Hospice | Payer: Medicare Other | Attending: Internal Medicine | Admitting: Internal Medicine

## 2021-05-16 DIAGNOSIS — Z7951 Long term (current) use of inhaled steroids: Secondary | ICD-10-CM

## 2021-05-16 DIAGNOSIS — I509 Heart failure, unspecified: Secondary | ICD-10-CM | POA: Diagnosis not present

## 2021-05-16 DIAGNOSIS — N179 Acute kidney failure, unspecified: Secondary | ICD-10-CM | POA: Diagnosis present

## 2021-05-16 DIAGNOSIS — Z515 Encounter for palliative care: Secondary | ICD-10-CM

## 2021-05-16 DIAGNOSIS — R54 Age-related physical debility: Secondary | ICD-10-CM | POA: Diagnosis present

## 2021-05-16 DIAGNOSIS — R778 Other specified abnormalities of plasma proteins: Secondary | ICD-10-CM

## 2021-05-16 DIAGNOSIS — Z681 Body mass index (BMI) 19 or less, adult: Secondary | ICD-10-CM

## 2021-05-16 DIAGNOSIS — R339 Retention of urine, unspecified: Secondary | ICD-10-CM

## 2021-05-16 DIAGNOSIS — I5043 Acute on chronic combined systolic (congestive) and diastolic (congestive) heart failure: Secondary | ICD-10-CM | POA: Diagnosis present

## 2021-05-16 DIAGNOSIS — I130 Hypertensive heart and chronic kidney disease with heart failure and stage 1 through stage 4 chronic kidney disease, or unspecified chronic kidney disease: Secondary | ICD-10-CM | POA: Diagnosis present

## 2021-05-16 DIAGNOSIS — Z801 Family history of malignant neoplasm of trachea, bronchus and lung: Secondary | ICD-10-CM | POA: Diagnosis not present

## 2021-05-16 DIAGNOSIS — Z7982 Long term (current) use of aspirin: Secondary | ICD-10-CM

## 2021-05-16 DIAGNOSIS — R0602 Shortness of breath: Secondary | ICD-10-CM

## 2021-05-16 DIAGNOSIS — J90 Pleural effusion, not elsewhere classified: Secondary | ICD-10-CM | POA: Diagnosis present

## 2021-05-16 DIAGNOSIS — I2510 Atherosclerotic heart disease of native coronary artery without angina pectoris: Secondary | ICD-10-CM | POA: Diagnosis present

## 2021-05-16 DIAGNOSIS — Z79899 Other long term (current) drug therapy: Secondary | ICD-10-CM

## 2021-05-16 DIAGNOSIS — E43 Unspecified severe protein-calorie malnutrition: Secondary | ICD-10-CM | POA: Diagnosis present

## 2021-05-16 DIAGNOSIS — I252 Old myocardial infarction: Secondary | ICD-10-CM

## 2021-05-16 DIAGNOSIS — Z825 Family history of asthma and other chronic lower respiratory diseases: Secondary | ICD-10-CM | POA: Diagnosis not present

## 2021-05-16 DIAGNOSIS — J449 Chronic obstructive pulmonary disease, unspecified: Secondary | ICD-10-CM | POA: Diagnosis present

## 2021-05-16 DIAGNOSIS — Z66 Do not resuscitate: Secondary | ICD-10-CM | POA: Diagnosis present

## 2021-05-16 DIAGNOSIS — M109 Gout, unspecified: Secondary | ICD-10-CM | POA: Diagnosis present

## 2021-05-16 DIAGNOSIS — I248 Other forms of acute ischemic heart disease: Secondary | ICD-10-CM | POA: Diagnosis present

## 2021-05-16 DIAGNOSIS — Z841 Family history of disorders of kidney and ureter: Secondary | ICD-10-CM | POA: Diagnosis not present

## 2021-05-16 DIAGNOSIS — I10 Essential (primary) hypertension: Secondary | ICD-10-CM | POA: Diagnosis not present

## 2021-05-16 DIAGNOSIS — N1831 Chronic kidney disease, stage 3a: Secondary | ICD-10-CM | POA: Diagnosis present

## 2021-05-16 DIAGNOSIS — Z888 Allergy status to other drugs, medicaments and biological substances status: Secondary | ICD-10-CM

## 2021-05-16 DIAGNOSIS — I255 Ischemic cardiomyopathy: Secondary | ICD-10-CM | POA: Diagnosis present

## 2021-05-16 DIAGNOSIS — K5900 Constipation, unspecified: Secondary | ICD-10-CM | POA: Diagnosis present

## 2021-05-16 DIAGNOSIS — Z8616 Personal history of COVID-19: Secondary | ICD-10-CM | POA: Diagnosis not present

## 2021-05-16 DIAGNOSIS — Z87891 Personal history of nicotine dependence: Secondary | ICD-10-CM | POA: Diagnosis not present

## 2021-05-16 DIAGNOSIS — I1310 Hypertensive heart and chronic kidney disease without heart failure, with stage 1 through stage 4 chronic kidney disease, or unspecified chronic kidney disease: Secondary | ICD-10-CM

## 2021-05-16 DIAGNOSIS — E785 Hyperlipidemia, unspecified: Secondary | ICD-10-CM | POA: Diagnosis present

## 2021-05-16 DIAGNOSIS — G629 Polyneuropathy, unspecified: Secondary | ICD-10-CM | POA: Diagnosis present

## 2021-05-16 LAB — URINALYSIS, ROUTINE W REFLEX MICROSCOPIC
Bacteria, UA: NONE SEEN
Bilirubin Urine: NEGATIVE
Glucose, UA: NEGATIVE mg/dL
Ketones, ur: NEGATIVE mg/dL
Leukocytes,Ua: NEGATIVE
Nitrite: NEGATIVE
Protein, ur: NEGATIVE mg/dL
Specific Gravity, Urine: 1.01 (ref 1.005–1.030)
Squamous Epithelial / HPF: NONE SEEN (ref 0–5)
pH: 7 (ref 5.0–8.0)

## 2021-05-16 LAB — CBC WITH DIFFERENTIAL/PLATELET
Abs Immature Granulocytes: 0.02 10*3/uL (ref 0.00–0.07)
Basophils Absolute: 0 10*3/uL (ref 0.0–0.1)
Basophils Relative: 0 %
Eosinophils Absolute: 0 10*3/uL (ref 0.0–0.5)
Eosinophils Relative: 0 %
HCT: 43.6 % (ref 39.0–52.0)
Hemoglobin: 14.9 g/dL (ref 13.0–17.0)
Immature Granulocytes: 0 %
Lymphocytes Relative: 25 %
Lymphs Abs: 1.4 10*3/uL (ref 0.7–4.0)
MCH: 32.7 pg (ref 26.0–34.0)
MCHC: 34.2 g/dL (ref 30.0–36.0)
MCV: 95.8 fL (ref 80.0–100.0)
Monocytes Absolute: 0.4 10*3/uL (ref 0.1–1.0)
Monocytes Relative: 8 %
Neutro Abs: 3.8 10*3/uL (ref 1.7–7.7)
Neutrophils Relative %: 67 %
Platelets: 214 10*3/uL (ref 150–400)
RBC: 4.55 MIL/uL (ref 4.22–5.81)
RDW: 15.5 % (ref 11.5–15.5)
WBC: 5.7 10*3/uL (ref 4.0–10.5)
nRBC: 0 % (ref 0.0–0.2)

## 2021-05-16 LAB — COMPREHENSIVE METABOLIC PANEL
ALT: 49 U/L — ABNORMAL HIGH (ref 0–44)
AST: 49 U/L — ABNORMAL HIGH (ref 15–41)
Albumin: 3.3 g/dL — ABNORMAL LOW (ref 3.5–5.0)
Alkaline Phosphatase: 127 U/L — ABNORMAL HIGH (ref 38–126)
Anion gap: 7 (ref 5–15)
BUN: 32 mg/dL — ABNORMAL HIGH (ref 8–23)
CO2: 34 mmol/L — ABNORMAL HIGH (ref 22–32)
Calcium: 8.8 mg/dL — ABNORMAL LOW (ref 8.9–10.3)
Chloride: 93 mmol/L — ABNORMAL LOW (ref 98–111)
Creatinine, Ser: 1.43 mg/dL — ABNORMAL HIGH (ref 0.61–1.24)
GFR, Estimated: 50 mL/min — ABNORMAL LOW (ref >60)
Glucose, Bld: 120 mg/dL — ABNORMAL HIGH (ref 70–99)
Potassium: 4 mmol/L (ref 3.5–5.1)
Sodium: 134 mmol/L — ABNORMAL LOW (ref 135–145)
Total Bilirubin: 1.5 mg/dL — ABNORMAL HIGH (ref 0.3–1.2)
Total Protein: 7.4 g/dL (ref 6.5–8.1)

## 2021-05-16 LAB — BRAIN NATRIURETIC PEPTIDE: B Natriuretic Peptide: 1007.1 pg/mL — ABNORMAL HIGH (ref 0.0–100.0)

## 2021-05-16 LAB — ECHOCARDIOGRAM COMPLETE
AR max vel: 2.5 cm2
AV Area VTI: 2.48 cm2
AV Area mean vel: 2.51 cm2
AV Mean grad: 1 mmHg
AV Peak grad: 2 mmHg
Ao pk vel: 0.71 m/s
Area-P 1/2: 5.06 cm2
Height: 73 in
MV VTI: 1.42 cm2
S' Lateral: 3.3 cm
Weight: 2222.24 oz

## 2021-05-16 LAB — RESP PANEL BY RT-PCR (FLU A&B, COVID) ARPGX2
Influenza A by PCR: NEGATIVE
Influenza B by PCR: NEGATIVE
SARS Coronavirus 2 by RT PCR: POSITIVE — AB

## 2021-05-16 LAB — TROPONIN I (HIGH SENSITIVITY)
Troponin I (High Sensitivity): 538 ng/L (ref <18)
Troponin I (High Sensitivity): 442 ng/L (ref <18)
Troponin I (High Sensitivity): 433 ng/L (ref <18)
Troponin I (High Sensitivity): 450 ng/L (ref <18)

## 2021-05-16 MED ORDER — PREDNISONE 10 MG PO TABS
10.0000 mg | ORAL_TABLET | Freq: Every day | ORAL | Status: DC
  Administered 2021-05-16 – 2021-05-17 (×2): 10 mg via ORAL
  Filled 2021-05-16 (×2): qty 1

## 2021-05-16 MED ORDER — DM-GUAIFENESIN ER 30-600 MG PO TB12: 1.0000 | ORAL_TABLET | Freq: Two times a day (BID) | ORAL | Status: DC | PRN

## 2021-05-16 MED ORDER — OXYCODONE HCL 5 MG PO TABS
5.0000 mg | ORAL_TABLET | Freq: Four times a day (QID) | ORAL | Status: DC | PRN
  Administered 2021-05-16 – 2021-05-17 (×3): 5 mg via ORAL
  Filled 2021-05-16 (×3): qty 1

## 2021-05-16 MED ORDER — SPIRONOLACTONE 25 MG PO TABS
25.0000 mg | ORAL_TABLET | Freq: Every day | ORAL | Status: DC
  Filled 2021-05-16: qty 1

## 2021-05-16 MED ORDER — METOPROLOL SUCCINATE ER 50 MG PO TB24
25.0000 mg | ORAL_TABLET | Freq: Every day | ORAL | Status: DC
  Administered 2021-05-16: 25 mg via ORAL
  Filled 2021-05-16 (×3): qty 1

## 2021-05-16 MED ORDER — ACETAMINOPHEN 650 MG RE SUPP: 650.0000 mg | Freq: Four times a day (QID) | RECTAL | Status: DC | PRN

## 2021-05-16 MED ORDER — TIOTROPIUM BROMIDE MONOHYDRATE 18 MCG IN CAPS
1.0000 | ORAL_CAPSULE | Freq: Every day | RESPIRATORY_TRACT | Status: DC
  Filled 2021-05-16: qty 5

## 2021-05-16 MED ORDER — MIDODRINE HCL 5 MG PO TABS
10.0000 mg | ORAL_TABLET | Freq: Three times a day (TID) | ORAL | Status: DC
  Administered 2021-05-17 (×2): 10 mg via ORAL
  Filled 2021-05-16 (×2): qty 2

## 2021-05-16 MED ORDER — HYDRALAZINE HCL 20 MG/ML IJ SOLN: 5.0000 mg | INTRAMUSCULAR | Status: DC | PRN

## 2021-05-16 MED ORDER — ATORVASTATIN CALCIUM 20 MG PO TABS
40.0000 mg | ORAL_TABLET | Freq: Every day | ORAL | Status: DC
  Administered 2021-05-16 – 2021-05-17 (×2): 40 mg via ORAL
  Filled 2021-05-16 (×3): qty 2

## 2021-05-16 MED ORDER — ASPIRIN 81 MG PO CHEW
81.0000 mg | CHEWABLE_TABLET | Freq: Every day | ORAL | Status: DC
  Administered 2021-05-16 – 2021-05-17 (×2): 81 mg via ORAL
  Filled 2021-05-16 (×2): qty 1

## 2021-05-16 MED ORDER — ALBUTEROL SULFATE (2.5 MG/3ML) 0.083% IN NEBU: 3.0000 mL | INHALATION_SOLUTION | RESPIRATORY_TRACT | Status: DC | PRN

## 2021-05-16 MED ORDER — FLUTICASONE-UMECLIDIN-VILANT 100-62.5-25 MCG/INH IN AEPB: 1.0000 | INHALATION_SPRAY | Freq: Every day | RESPIRATORY_TRACT | Status: DC

## 2021-05-16 MED ORDER — POLYETHYLENE GLYCOL 3350 17 G PO PACK
17.0000 g | PACK | Freq: Every day | ORAL | Status: DC | PRN
  Administered 2021-05-16: 17 g via ORAL
  Filled 2021-05-16: qty 1

## 2021-05-16 MED ORDER — ALLOPURINOL 100 MG PO TABS
100.0000 mg | ORAL_TABLET | Freq: Every day | ORAL | Status: DC | PRN
  Filled 2021-05-16: qty 1

## 2021-05-16 MED ORDER — FUROSEMIDE 10 MG/ML IJ SOLN
80.0000 mg | Freq: Once | INTRAMUSCULAR | Status: AC
  Administered 2021-05-16: 80 mg via INTRAVENOUS
  Filled 2021-05-16: qty 8

## 2021-05-16 MED ORDER — ONDANSETRON HCL 4 MG/2ML IJ SOLN
4.0000 mg | Freq: Three times a day (TID) | INTRAMUSCULAR | Status: DC | PRN
  Administered 2021-05-16: 4 mg via INTRAVENOUS
  Filled 2021-05-16: qty 2

## 2021-05-16 MED ORDER — TAMSULOSIN HCL 0.4 MG PO CAPS
0.4000 mg | ORAL_CAPSULE | Freq: Every day | ORAL | Status: DC
  Administered 2021-05-16: 0.4 mg via ORAL
  Filled 2021-05-16 (×2): qty 1

## 2021-05-16 MED ORDER — MORPHINE SULFATE (PF) 2 MG/ML IV SOLN
1.0000 mg | INTRAVENOUS | Status: DC | PRN
Start: 2021-05-16 — End: 2021-05-17
  Administered 2021-05-16: 1 mg via INTRAVENOUS
  Filled 2021-05-16: qty 1

## 2021-05-16 MED ORDER — ENOXAPARIN SODIUM 40 MG/0.4ML IJ SOSY
40.0000 mg | PREFILLED_SYRINGE | INTRAMUSCULAR | Status: DC
  Administered 2021-05-16: 40 mg via SUBCUTANEOUS
  Filled 2021-05-16: qty 0.4

## 2021-05-16 MED ORDER — ENSURE ENLIVE PO LIQD
237.0000 mL | Freq: Three times a day (TID) | ORAL | Status: DC
  Administered 2021-05-17: 237 mL via ORAL

## 2021-05-16 MED ORDER — FUROSEMIDE 10 MG/ML IJ SOLN
40.0000 mg | Freq: Two times a day (BID) | INTRAMUSCULAR | Status: DC
  Administered 2021-05-16: 40 mg via INTRAVENOUS
  Filled 2021-05-16 (×2): qty 4

## 2021-05-16 NOTE — ED Triage Notes (Signed)
From home via [facility]. Pt reports SOB x a few weeks, got worse when Lasix was decreased last Friday. Bilat edema to LE, states "they're always that way". Pt states they are hungry and want food, "I can't find food without salt. I want something to eat". Hx Covid almost a month ago.

## 2021-05-16 NOTE — ED Notes (Signed)
Pt given sandwich tray, socks, and diet gingerale.

## 2021-05-16 NOTE — Consult Note (Signed)
Urology Consult  I have been asked to see the patient by Dr. Clyde Lundborg, for evaluation and management of difficult Foley placement.  Chief Complaint: Shortness of breath, inability to urinate  History of Present Illness: Todd Perez is a 80 y.o. 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  He was subsequently noted to have urinary retention with bladder scan >756mL.  Nursing attempted to place Foley catheter twice, but was unsuccessful.  Urology has been consulted for Foley placement.  Patient is accompanied at the bedside today by his daughter.  He reports an approximate 1 month history of intermittent hours long episodes of difficulty urinating which have resolved on their own.  He denies a significant urinary history prior to this.  He denies having seen urology in the past.  He states he has had prostate exams in the past and that those results of come back "elevated", but they have "come down."  Unclear if he may be referring to fluctuating PSA.  No PSA results available per chart review.  No cross-sectional abdominal imaging available for review.  Notably, patient is requesting to go home with hospice.  Past Medical History:  Diagnosis Date   CAD (coronary artery disease)    cath in Nov 2019   CHF (congestive heart failure) (HCC)    Hyperlipidemia    Hypertension    Ischemic cardiomyopathy    EF 40%   Neuromuscular disorder (HCC)    Peripheral neuropathy     Past Surgical History:  Procedure Laterality Date   COLONOSCOPY WITH PROPOFOL N/A 02/06/2015   Procedure: COLONOSCOPY WITH PROPOFOL;  Surgeon: Wallace Cullens, MD;  Location: [facility] ENDOSCOPY;  Service: Gastroenterology;  Laterality: N/A;   LEFT HEART CATH AND CORONARY ANGIOGRAPHY Left 07/08/2018   Procedure: LEFT HEART CATH AND CORONARY ANGIOGRAPHY;  Surgeon: Dalia Heading,  MD;  Location: [facility] INVASIVE CV LAB;  Service: Cardiovascular;  Laterality: Left;   thyroid nodule removal      Home Medications:  Current Meds  Medication Sig   aspirin 81 MG chewable tablet Chew 1 tablet (81 mg total) by mouth daily.   furosemide (LASIX) 20 MG tablet Take 20 mg by mouth 2 (two) times daily.   metoprolol succinate (TOPROL-XL) 25 MG 24 hr tablet Take 1 tablet (25 mg total) by mouth daily.   midodrine (PROAMATINE) 10 MG tablet Take 10 mg by mouth 3 (three) times daily.   potassium chloride 20 MEQ TBCR Take 20 mEq by mouth daily.   predniSONE (DELTASONE) 10 MG tablet Take 10 mg by mouth daily.   SPIRIVA RESPIMAT 2.5 MCG/ACT AERS Inhale 2 puffs into the lungs daily.   spironolactone (ALDACTONE) 25 MG tablet Take 25 mg by mouth daily.   sulfamethoxazole-trimethoprim (BACTRIM) 400-80 MG tablet Take 1 tablet by mouth 3 (three) times a week.   tamsulosin (FLOMAX) 0.4 MG CAPS capsule Take 0.4 mg by mouth daily.   torsemide (DEMADEX) 100 MG tablet Take 100 mg by mouth daily.   TRELEGY ELLIPTA 100-62.5-25 MCG/INH AEPB Inhale 1 puff into the lungs daily.    Allergies:  Allergies  Allergen Reactions   Benicar Hct [Olmesartan Medoxomil-Hctz] Itching   Lisinopril Cough    Family History  Problem Relation Age of Onset   Kidney disease Mother    Emphysema Father    Lung cancer Father     Social History:  reports  that he has quit smoking. His smoking use included cigarettes. He has a 5.00 pack-year smoking history. He has never used smokeless tobacco. He reports that he does not currently use alcohol. No history on file for drug use.  ROS: A complete review of systems was performed.  All systems are negative except for pertinent findings as noted.  Physical Exam:  Vital signs in last 24 hours: Temp:  [98.4 F (36.9 C)] 98.4 F (36.9 C) (10/05 0812) Pulse Rate:  [82-98] 84 (10/05 1330) Resp:  [18-27] 19 (10/05 1330) BP: (89-113)/(64-84) 105/79 (10/05 1330) SpO2:  [91  %-100 %] 100 % (10/05 1330) Weight:  [63 kg] 63 kg (10/05 0819) Constitutional:  Alert and oriented, no acute distress HEENT: [place] AT, moist mucus membranes Cardiovascular: No clubbing, cyanosis, or edema Respiratory: Normal respiratory effort GU: Uncircumcised penis Skin: No rashes, bruises or suspicious lesions Neurologic: Grossly intact, no focal deficits, moving all 4 extremities Psychiatric: Normal mood and affect  Laboratory Data:  Recent Labs    05/16/21 0823  WBC 5.7  HGB 14.9  HCT 43.6   Recent Labs    05/16/21 0823  NA 134*  K 4.0  CL 93*  CO2 34*  GLUCOSE 120*  BUN 32*  CREATININE 1.43*  CALCIUM 8.8*   Urinalysis    Component Value Date/Time   COLORURINE YELLOW (A) 05/16/2021 1415   APPEARANCEUR CLEAR (A) 05/16/2021 1415   LABSPEC 1.010 05/16/2021 1415   PHURINE 7.0 05/16/2021 1415   GLUCOSEU NEGATIVE 05/16/2021 1415   HGBUR SMALL (A) 05/16/2021 1415   BILIRUBINUR NEGATIVE 05/16/2021 1415   KETONESUR NEGATIVE 05/16/2021 1415   PROTEINUR NEGATIVE 05/16/2021 1415   NITRITE NEGATIVE 05/16/2021 1415   LEUKOCYTESUR NEGATIVE 05/16/2021 1415   Results for orders placed or performed during the hospital encounter of 04/10/21  Resp Panel by RT-PCR (Flu A&B, Covid) Nasopharyngeal Swab     Status: Abnormal   Collection Time: 04/10/21  7:14 PM   Specimen: Nasopharyngeal Swab; Nasopharyngeal(NP) swabs in vial transport medium  Result Value Ref Range Status   SARS Coronavirus 2 by RT PCR POSITIVE (A) NEGATIVE Final    Comment: RESULT CALLED TO, READ BACK BY AND VERIFIED WITH: tia bullock @2013  on 04/10/21 sk[facility] (NOTE) SARS-CoV-2 target nucleic acids are DETECTED.  The SARS-CoV-2 RNA is generally detectable in upper respiratory specimens during the acute phase of infection. Positive results are indicative of the presence of the identified virus, but do not rule out bacterial infection or co-infection with other pathogens not detected by the test. Clinical  correlation with patient history and other diagnostic information is necessary to determine patient infection status. The expected result is Negative.  Fact Sheet for Patients: 04/12/21  Fact Sheet for Healthcare Providers: BloggerCourse.com  This test is not yet approved or cleared by the [place] FDA and  has been authorized for detection and/or diagnosis of SARS-CoV-2 by FDA under an Emergency Use Authorization (EUA).  This EUA will remain in effect (meaning this test can be  used) for the duration of  the COVID-19 declaration under Section 564(b)(1) of the Act, 21 U.S.C. section 360bbb-3(b)(1), unless the authorization is terminated or revoked sooner.     Influenza A by PCR NEGATIVE NEGATIVE Final   Influenza B by PCR NEGATIVE NEGATIVE Final    Comment: (NOTE) The Xpert Xpress SARS-CoV-2/FLU/RSV plus assay is intended as an aid in the diagnosis of influenza from Nasopharyngeal swab specimens and should not be used as a sole basis for treatment. Nasal  washings and aspirates are unacceptable for Xpert Xpress SARS-CoV-2/FLU/RSV testing.  Fact Sheet for Patients: BloggerCourse.com  Fact Sheet for Healthcare Providers: SeriousBroker.it  This test is not yet approved or cleared by the [place] FDA and has been authorized for detection and/or diagnosis of SARS-CoV-2 by FDA under an Emergency Use Authorization (EUA). This EUA will remain in effect (meaning this test can be used) for the duration of the COVID-19 declaration under Section 564(b)(1) of the Act, 21 U.S.C. section 360bbb-3(b)(1), unless the authorization is terminated or revoked.  Performed at [facility], [address] [place], [place] [address]   Body fluid culture w Gram Stain     Status: None   Collection Time: 04/11/21 10:34 AM   Specimen: PATH Cytology Pleural fluid  Result  Value Ref Range Status   Specimen Description   Final    PLEURAL Performed at [facility], [address] [place], [place] [address]    Special Requests   Final    NONE Performed at [facility], [address] [place], [place] [address]    Gram Stain   Final    RARE WBC PRESENT, PREDOMINANTLY MONONUCLEAR NO ORGANISMS SEEN    Culture   Final    NO GROWTH 3 DAYS Performed at [facility], [address] [place], [place] [address]    Report Status 04/14/2021 FINAL  Final   Assessment & Plan:  80 year old male admitted with CHF exacerbation and found to have urinary retention.  I was able to place a Foley catheter at the bedside today, see preprocedure note below for more information.  No gross hematuria noted, though he may develop some of this due to rapid bladder decompression.  Okay to defer to patient and family's wishes regarding follow-up outpatient voiding trial in 2 weeks versus keeping catheter in place for comfort with plans for discharge to hospice.  If he plans to keep his Foley catheter, he will need it exchanged on a monthly basis.  Simple Catheter Placement  Due to urinary retention patient is present today for a foley cath placement.  Patient was cleaned and prepped in a sterile fashion with betadine and 2 tubes of 2% lidocaine jelly were instilled into the urethra. A 18 FR coud foley catheter was inserted, urine return was noted  , urine was yellow in color.  The balloon was filled with 10cc of sterile water.  A night bag was attached for drainage. Patient tolerated well, no complications were noted   Performed by: Carman Ching, PA-C and Michiel Cowboy, PA-C  Thank you for involving me in this patient's care, please page with any further questions or concerns.  Carman Ching, PA-C 05/16/2021 3:14 PM

## 2021-05-16 NOTE — Consult Note (Signed)
   Heart Failure Nurse Navigator Note  HFrEF 25-30%.  Grade 2 diastolic dysfunction.  Moderate mitral regurgitation.  Moderate tricuspid regurgitation.  He presented to the emergency room with worsening shortness of breath.  Comorbidities:  Hypertension Coronary artery disease Hyperlipidemia Gout  Medications:  Furosemide 40 mg IV every 12 hours  Labs:  BNP 1007, sodium 134, potassium 4, chloride 93, CO2 34, BUN 32, creatinine 1.43, albumin 3.3  Met earlier this morning with the patient and his daughter Lorriane Shire in the emergency room. He had last been in the emergency room on September 7, and prior to that discharged on 9 /3/22 for heart failure.  Was a no-show for his last heart failure appointment.  In talking with his daughter, she states that her father does not trust their cooking at home and eats very poorly as he is afraid of taking in sodium.  He does drink boost.  When questioned, he states that he was weighing himself daily and taking all his medications as ordered.  Had given daughter low-sodium cookbook along with dietary low-sodium information.  She would also like to speak to the dietitian.  The patient was seen later by cardiology and patient is requesting to go home with hospice.  Pricilla Riffle RN [facility]

## 2021-05-16 NOTE — H&P (Addendum)
History and Physical    Todd Perez OEV:[id number] DOB: January 27, 1941 DOA: 05/16/2021  Referring MD/NP/PA:   PCP: Todd Mina, MD   Patient coming from:  The patient is coming from home.  At baseline, pt is independent for most of ADL.        Chief Complaint: Shortness of breath  HPI: Todd Perez is a 80 y.o. male with medical history significant CHF with EF 25-30%, COPD, hypertension, hyperlipidemia, gout, CAD, non-STEMI, peripheral neuropathy, CKD-3, recent COVID infection 04/10/2021, who presents with shortness breath.  Patient was recently hospitalized from 8/30-9/3 due to COVID infection.  Patient also had bilateral pleural effusion.  Patient underwent bilateral thoracentesis in previous admission. He states that his shortness has been worsening for more than a week.  He has some cough with little mucus production.  Denies chest pain, fever or chills.  Patient is constipated, no nausea vomiting, diarrhea or abdominal pain for no symptoms of UTI. He also has bilateral leg edema which is chronic issue per patient.  He was recently switched from Lasix to torsemide for CHF.   ED Course: pt was found to have WBC 5.7, BMP 1007, troponin level 538, worsening renal function, temperature normal, blood pressure 113/84, heart rate 89, RR 18, oxygen saturation 100% on room air.  Chest x-ray showed CHF with moderate bilateral pleural effusion.  Patient is admitted to progressive bed as inpatient.  Dr. Beatrix Fetters of card is consulted.  Review of Systems:   General: no fevers, chills, has poor appetite, has fatigue HEENT: no blurry vision, hearing changes or sore throat Respiratory: has dyspnea, coughing, no wheezing CV: no chest pain, no palpitations GI: no nausea, vomiting, abdominal pain, diarrhea, has constipation GU: no dysuria, burning on urination, increased urinary frequency, hematuria  Ext: has leg edema Neuro: no unilateral weakness, numbness, or tingling, no vision change or hearing  loss Skin: no rash, no skin tear. MSK: No muscle spasm, no deformity, no limitation of range of movement in spin Heme: No easy bruising.  Travel history: No recent long distant travel.  Allergy:  Allergies  Allergen Reactions   Benicar Hct [Olmesartan Medoxomil-Hctz] Itching   Lisinopril Cough    Past Medical History:  Diagnosis Date   CAD (coronary artery disease)    cath in Nov 2019   CHF (congestive heart failure) (HCC)    Hyperlipidemia    Hypertension    Ischemic cardiomyopathy    EF 40%   Neuromuscular disorder (HCC)    Peripheral neuropathy     Past Surgical History:  Procedure Laterality Date   COLONOSCOPY WITH PROPOFOL N/A 02/06/2015   Procedure: COLONOSCOPY WITH PROPOFOL;  Surgeon: Todd Cullens, MD;  Location[facility] ENDOSCOPY;  Service: Gastroenterology;  Laterality: N/A;   LEFT HEART CATH AND CORONARY ANGIOGRAPHY Left 07/08/2018   Procedure: LEFT HEART CATH AND CORONARY ANGIOGRAPHY;  Surgeon: Todd Heading, MD;  Location: [facility] INVASIVE CV LAB;  Service: Cardiovascular;  Laterality: Left;   thyroid nodule removal      Social History:  reports that he has quit smoking. His smoking use included cigarettes. He has a 5.00 pack-year smoking history. He has never used smokeless tobacco. He reports that he does not currently use alcohol. No history on file for drug use.  Family History:  Family History  Problem Relation Age of Onset   Kidney disease Mother    Emphysema Father    Lung cancer Father      Prior to Admission medications   Medication  Sig Start Date End Date Taking? Authorizing Provider  albuterol (VENTOLIN HFA) 108 (90 Base) MCG/ACT inhaler Inhale 2 puffs into the lungs every 4 (four) hours as needed. 03/26/21   [provider]  allopurinol (ZYLOPRIM) 100 MG tablet Take 100 mg by mouth daily as needed (gout prevention).     [provider]  amoxicillin-clavulanate (AUGMENTIN) 875-125 MG tablet Take 1 tablet by mouth 2 (two) times daily.  04/18/21   Todd Cheek, MD  aspirin 81 MG chewable tablet Chew 1 tablet (81 mg total) by mouth daily. 11/26/20   Todd Cove, MD  feeding supplement (ENSURE ENLIVE / ENSURE PLUS) LIQD Take 237 mLs by mouth 3 (three) times daily between meals. 04/14/21   Danford, Todd Lites, MD  furosemide (LASIX) 20 MG tablet Take 20 mg by mouth 2 (two) times daily. 01/25/21   [provider]  metoprolol succinate (TOPROL-XL) 25 MG 24 hr tablet Take 1 tablet (25 mg total) by mouth daily. 04/14/21   Danford, Todd Lites, MD  potassium chloride 20 MEQ TBCR Take 20 mEq by mouth daily. 11/25/20   Todd Cove, MD  SPIRIVA RESPIMAT 2.5 MCG/ACT AERS Inhale 2 puffs into the lungs daily. 04/02/21   [provider]  spironolactone (ALDACTONE) 25 MG tablet Take 25 mg by mouth daily. 02/15/21   [provider]  TRELEGY ELLIPTA 100-62.5-25 MCG/INH AEPB Inhale 1 puff into the lungs daily. 10/10/20   [provider]    Physical Exam: Vitals:   05/16/21 9163 05/16/21 0816 05/16/21 0819  BP:  113/84   Pulse: 89    Resp: 18    Temp: 98.4 F (36.9 C)    TempSrc: Oral    SpO2: 100%    Weight:   63 kg  Height:   6\' 1"  (1.854 m)   General: Not in acute distress HEENT:       Eyes: PERRL, EOMI, no scleral icterus.       ENT: No discharge from the ears and nose, no pharynx injection, no tonsillar enlargement.        Neck: positive JVD, no bruit, no mass felt. Heme: No neck lymph node enlargement. Cardiac: S1/S2, RRR, No murmurs, No gallops or rubs. Respiratory: Has decreased air movement bilaterally GI: Soft, nondistended, nontender, no rebound pain, no organomegaly, BS present. GU: No hematuria Ext: 1+ pitting leg edema bilaterally. 1+DP/PT pulse bilaterally. Musculoskeletal: No joint deformities, No joint redness or warmth, no limitation of ROM in spin. Skin: No rashes.  Neuro: Alert, oriented X3, cranial nerves II-XII grossly intact, moves all extremities normally.  Psych:  Patient is not psychotic, no suicidal or hemocidal ideation.  Labs on Admission: I have personally reviewed following labs and imaging studies  CBC: Recent Labs  Lab 05/16/21 0823  WBC 5.7  NEUTROABS 3.8  HGB 14.9  HCT 43.6  MCV 95.8  PLT 214   Basic Metabolic Panel: Recent Labs  Lab 05/16/21 0823  NA 134*  K 4.0  CL 93*  CO2 34*  GLUCOSE 120*  BUN 32*  CREATININE 1.43*  CALCIUM 8.8*   GFR: Estimated Creatinine Clearance: 36.7 mL/min (A) (by C-G formula based on SCr of 1.43 mg/dL (H)). Liver Function Tests: Recent Labs  Lab 05/16/21 0823  AST 49*  ALT 49*  ALKPHOS 127*  BILITOT 1.5*  PROT 7.4  ALBUMIN 3.3*   No results for input(s): LIPASE, AMYLASE in the last 168 hours. No results for input(s): AMMONIA in the last 168 hours. Coagulation Profile: No results for  input(s): INR, PROTIME in the last 168 hours. Cardiac Enzymes: No results for input(s): CKTOTAL, CKMB, CKMBINDEX, TROPONINI in the last 168 hours. BNP (last 3 results) No results for input(s): PROBNP in the last 8760 hours. HbA1C: No results for input(s): HGBA1C in the last 72 hours. CBG: No results for input(s): GLUCAP in the last 168 hours. Lipid Profile: No results for input(s): CHOL, HDL, LDLCALC, TRIG, CHOLHDL, LDLDIRECT in the last 72 hours. Thyroid Function Tests: No results for input(s): TSH, T4TOTAL, FREET4, T3FREE, THYROIDAB in the last 72 hours. Anemia Panel: No results for input(s): VITAMINB12, FOLATE, FERRITIN, TIBC, IRON, RETICCTPCT in the last 72 hours. Urine analysis: No results found for: COLORURINE, APPEARANCEUR, LABSPEC, PHURINE, GLUCOSEU, HGBUR, BILIRUBINUR, KETONESUR, PROTEINUR, UROBILINOGEN, NITRITE, LEUKOCYTESUR Sepsis Labs: @LABRCNTIP (procalcitonin:4,lacticidven:4) )No results found for this or any previous visit (from the past 240 hour(s)).   Radiological Exams on Admission: DG Chest 2 View  Result Date: 05/16/2021 CLINICAL DATA:  Pt reports SOB x a few weeks, got  worse when Lasix was decreased last Friday. Bilat edema to LE EXAM: CHEST - 2 VIEW COMPARISON:  04/18/2021 FINDINGS: Cardiac silhouette mildly enlarged.  No mediastinal or hilar masses. Moderate bilateral pleural effusions. Bilateral interstitial thickening. Hazy and more confluent opacity noted at both lung bases. No pneumothorax. Skeletal structures are intact. IMPRESSION: 1. Findings consistent with congestive heart failure with interstitial and mild airspace edema and moderate bilateral pleural effusions. Electronically Signed   By: 06/18/2021 M.D.   On: 05/16/2021 08:53     EKG: I have personally reviewed.  Sinus rhythm, QTC 480, LAD, poor R wave progression  Assessment/Plan Principal Problem:   Acute on chronic combined systolic and diastolic CHF (congestive heart failure) (HCC) Active Problems:   CAD (coronary artery disease)   Hypertension   Elevated troponin   Protein-calorie malnutrition, severe   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 (HCC)   Bilateral pleural effusion   COPD (chronic obstructive pulmonary disease) (HCC)   Acute on chronic combined systolic and diastolic CHF (congestive heart failure) (HCC): 2D echo on 11/23/2020 showed EF of 25-30% with grade 2 diastolic dysfunction.  Patient has bilateral leg edema, positive JVD, elevated BNP 1007, clinically consistent with CHF exacerbation.  Due to elevated troponin, Dr. 11/25/2020 of card is consulted.  -Will admit to progressive unit as inpatient -Lasix 40 mg bid by IV (pt received 1 dose of 80 mg of Lasix in ED) -Continue home spironolactone -2d echo -Daily weights -strict I/O's -Low salt diet -Fluid restriction -Obtain REDs Vest reading  CAD (coronary artery disease) and elevated trop: trop 538. No CP.  Likely due to demand ischemia. - Trend Trop --> if troponins are trending up, will start IV heparin - aspirin 81 mg daily - start lipitor 40 mg daily - Risk factor stratification: will  check FLP and A1C  - 2d echo - f/u card recommendations  Hypertension -Patient is on IV Lasix and spironolactone -Continue metoprolol -Patient is also on midodrine at home, will continue it, blood pressure 130/84  Protein-calorie malnutrition, severe: BMI 18.32 -Ensure  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 (HCC): Baseline creatinine 0.49 on 04/18/2021.  His creatinine is 1.43, BUN 32, likely due to multifactorial etiology, including possible cardiorenal syndrome, Bactrim use -Hold Bactrim -Avoid using renal toxic medications -Follow-up by BMP  Bilateral pleural effusion: Patient has a bilateral moderate pleural effusion, which is likely due to CHF.  Patient started on IV Lasix, hoping for effusion will  improve with IV Lasix treatment -If no response to IV Lasix treatment, need to consider to repeat thoracentesis  COPD (chronic obstructive pulmonary disease) (HCC) -Continue home prednisone 10 mg daily -Bronchodilators -Hold Bactrim due to worsening renal function.  Patient does not have fever or leukocytosis.  Not septic.  Addendum: Patient developed urinary retention, nurse attempted to put a Foley catheter twice without success, urology, Dr. Lourena Simmonds is consulted, Foley catheter is placed. 2.   Pt told Dr. Beatrix Fetters of of card that he wants to do hospice care. Consulted palliative care team and TOC.   DVT ppx: SQ Lovenox Code Status: DNR (I discussed with patient in the presence of her daughter, and explained the meaning of CODE STATUS. Patient wants to be DNR)  Family Communication:  Yes, patient's daughter at bed side Disposition Plan:  Anticipate discharge back to previous environment Consults called: Dr. Beatrix Fetters of card  Admission status and  Level of care: Progressive Cardiac:   as inpt        Status is: Inpatient  Remains inpatient appropriate because:Inpatient level of care appropriate due to severity of illness  Dispo: The patient is from:  Home              Anticipated d/c is to: Home              Patient currently is not medically stable to d/c.   Difficult to place patient No           Date of Service 05/16/2021    Lorretta Harp [facility]   If 7PM-7AM, please contact night-coverage [email or web address] 05/16/2021, 11:04 AM

## 2021-05-16 NOTE — ED Notes (Signed)
Two unsuccessful catheter attempts by this RN with Pauline Aus assisting. Coude attempted on second try. MD Clyde Lundborg made aware at this time.

## 2021-05-16 NOTE — Consult Note (Signed)
[facility] Cardiology  CARDIOLOGY CONSULT NOTE  Patient ID: Todd Perez MRN: [id number] DOB/AGE: [birth year] 80 y.o.  Admit date: 05/16/2021 Referring Physician Lorretta Harp Primary Physician [name], MD Primary Cardiologist Mariel Kansky, MD Reason for Consultation AoCHF  HPI:  Todd Perez is an 80 year old male with a history of HFrEF (EF 25 to 30%, moderate MR), hypertension, hyperlipidemia, CAD (40% LCx lesion, occluded RPL) who was admitted to the hospital with a CHF exacerbation.  He was seen by Dr Karna Christmas in clinic and was started on torsemide. This seemed to help with his fluid accumulation, but he didn't like how he felt. He says he didn't enjoy having to go the bathroom, and was having trouble going once he got there. His appetite is poor and he is not eating well; says he has no taste. He says that even with the higher dose diuretic, he still felt short of breath, but this has worsened over the last 2 days. He says that this morning it was so bad that he couldn't lie down.  He was recently admitted to the hospital from 8/30 to 04/14/2021 with a CHF exacerbation.  During that hospitalization he had a bilateral thoracentesis completed for chronic pleural effusions.  He was treated with IV Lasix with good urine output and was discharged home on Lasix p.o.  After lots of discussion, the patient tells me and his daughter that he doesn't want to suffer anymore and he is tired of fighting. He would like to go home with hospice.    Review of systems complete and found to be negative unless listed above     Past Medical History:  Diagnosis Date   CAD (coronary artery disease)    cath in Nov 2019   CHF (congestive heart failure) (HCC)    Hyperlipidemia    Hypertension    Ischemic cardiomyopathy    EF 40%   Neuromuscular disorder (HCC)    Peripheral neuropathy     Past Surgical History:  Procedure Laterality Date   COLONOSCOPY WITH PROPOFOL N/A 02/06/2015   Procedure: COLONOSCOPY  WITH PROPOFOL;  Surgeon: Wallace Cullens, MD;  Location: [facility] ENDOSCOPY;  Service: Gastroenterology;  Laterality: N/A;   LEFT HEART CATH AND CORONARY ANGIOGRAPHY Left 07/08/2018   Procedure: LEFT HEART CATH AND CORONARY ANGIOGRAPHY;  Surgeon: Dalia Heading, MD;  Location: [facility] INVASIVE CV LAB;  Service: Cardiovascular;  Laterality: Left;   thyroid nodule removal      (Not in a hospital admission)  Social History   Socioeconomic History   Marital status: Married    Spouse name: Not on file   Number of children: Not on file   Years of education: Not on file   Highest education level: Not on file  Occupational History   Not on file  Tobacco Use   Smoking status: Former    Packs/day: 0.25    Years: 20.00    Pack years: 5.00    Types: Cigarettes   Smokeless tobacco: Never  Vaping Use   Vaping Use: Never used  Substance and Sexual Activity   Alcohol use: Not Currently   Drug use: Not on file   Sexual activity: Not on file  Other Topics Concern   Not on file  Social History Narrative   Independent at baseline.  Lives at home with his wife.  Works as a Production designer, theatre/television/film person in an apartment complex in [employer]   Social Determinants of Corporate investment banker Strain: Not on [employer] Insecurity:  Not on file  Transportation Needs: Not on file  Physical Activity: Not on file  Stress: Not on file  Social Connections: Not on file  Intimate Partner Violence: Not on file    Family History  Problem Relation Age of Onset   Kidney disease Mother    Emphysema Father    Lung cancer Father       Review of systems complete and found to be negative unless listed above      PHYSICAL EXAM  General: Cachectic. Chronically ill appearing.  HEENT:  Normocephalic and atramatic. Temporal wasting.  Neck:  JVD is elevated  Lungs: Decreased breath sounds in BL bases.  Heart: HRRR . Normal S1 and S2 without gallops or murmurs.  Abdomen: Bowel sounds are positive, abdomen soft and non-tender   Msk:  Muscle wasting.  Extremities: No clubbing, cyanosis or edema.   Neuro: Alert and oriented X 3. Psych:  Good affect, responds appropriately  Labs:   Lab Results  Component Value Date   WBC 5.7 05/16/2021   HGB 14.9 05/16/2021   HCT 43.6 05/16/2021   MCV 95.8 05/16/2021   PLT 214 05/16/2021    Recent Labs  Lab 05/16/21 0823  NA 134*  K 4.0  CL 93*  CO2 34*  BUN 32*  CREATININE 1.43*  CALCIUM 8.8*  PROT 7.4  BILITOT 1.5*  ALKPHOS 127*  ALT 49*  AST 49*  GLUCOSE 120*   Lab Results  Component Value Date   TROPONINI 0.22 (HH) 08/26/2018   No results found for: CHOL No results found for: HDL No results found for: LDLCALC No results found for: TRIG No results found for: CHOLHDL No results found for: LDLDIRECT    Radiology: DG Chest 2 View  Result Date: 05/16/2021 CLINICAL DATA:  Pt reports SOB x a few weeks, got worse when Lasix was decreased last Friday. Bilat edema to LE EXAM: CHEST - 2 VIEW COMPARISON:  04/18/2021 FINDINGS: Cardiac silhouette mildly enlarged.  No mediastinal or hilar masses. Moderate bilateral pleural effusions. Bilateral interstitial thickening. Hazy and more confluent opacity noted at both lung bases. No pneumothorax. Skeletal structures are intact. IMPRESSION: 1. Findings consistent with congestive heart failure with interstitial and mild airspace edema and moderate bilateral pleural effusions. Electronically Signed   By: Amie Portland M.D.   On: 05/16/2021 08:53   CT Chest Wo Contrast  Result Date: 04/18/2021 CLINICAL DATA:  Respiratory illness, nondiagnostic xray. Hemoptysis, shortness of breath. History of CHF and COVID-19. Left thoracentesis on 04/13/2021. History of left pneumothorax. EXAM: CT CHEST WITHOUT CONTRAST TECHNIQUE: Multidetector CT imaging of the chest was performed following the standard protocol without IV contrast. COMPARISON:  CT 11/20/2020 FINDINGS: Cardiovascular: Moderate cardiomegaly. Trace pericardial effusion. Stable  ascending thoracic aortic aneurysm measuring approximately 4.2 cm in diameter. Atherosclerotic calcifications of the aorta and coronary arteries. Main pulmonary trunk measures 3.5 cm in diameter. Mediastinum/Nodes: Stable mildly prominent precarinal lymph node. No axillary lymphadenopathy. Evaluation of the hilar structures is limited in the absence of intravenous contrast. Within this limitation, no obvious hilar adenopathy or mass is identified. No acute abnormality is identified involving the thyroid, trachea, or esophagus. Lungs/Pleura: Moderate right and small left pleural effusions with associated compressive atelectasis. Patchy consolidations within the left lower lobe and lingula as well as within the right lower lobe. Patchy areas of predominantly ground-glass opacities within the bilateral upper lobes. Trace left apical pneumothorax. No right-sided pneumothorax. Upper Abdomen: Ovoid calcifications within the upper pole of the left kidney measuring up  to 1.2 cm in diameter. No acute findings within the included upper abdomen. Musculoskeletal: No chest wall mass or suspicious bone lesions identified. IMPRESSION: 1. Persistent trace left apical pneumothorax. 2. Moderate right and small left pleural effusions with associated compressive atelectasis. 3. Patchy consolidations within the left lower lobe and lingula as well as within the right lower lobe. Patchy areas of predominantly ground-glass opacities within the bilateral upper lobes. Findings may represent pulmonary edema versus multifocal pneumonia. 4. Moderate cardiomegaly with trace pericardial effusion. 5. Dilated main pulmonary trunk suggesting pulmonary arterial hypertension. 6. 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 Recommend annual imaging followup by CTA or MRA. This recommendation follows 2010 ACCF/AHA/AATS/ACR/ASA/SCA/SCAI/SIR/STS/SVM Guidelines for the Diagnosis and Management of Patients with Thoracic  Aortic Disease. Circulation. 2010; 121: V761-Y073. Aortic aneurysm NOS (ICD10-I71.9) Aortic Atherosclerosis (ICD10-I70.0). Electronically Signed   By: Duanne Guess D.O.   On: 04/18/2021 09:12   DG Chest Portable 1 View  Result Date: 04/18/2021 CLINICAL DATA:  80 year old male with shortness of breath and hemoptysis. Recently hospitalized for CHF and COVID. EXAM: PORTABLE CHEST 1 VIEW COMPARISON:  Portable chest 04/14/2021 and earlier. FINDINGS: Portable AP semi upright view at 0651 hours. Possible trace residual left pneumothorax. Skin fold artifacts also noted at the chest. Stable lung volumes. Mixed layering and loculated left and layering right pleural effusions with coarse and patchy bilateral interstitial and basilar opacity. Stable cardiac size and mediastinal contours. Visualized tracheal air column is within normal limits. Ventilation in the left lung has decreased since 04/13/2021. The right lung is stable. No acute osseous abnormality identified. Negative visible bowel gas pattern. IMPRESSION: 1. Possible trace continued left pneumothorax. 2. Bilateral pleural effusions, partially loculated on the left, with patchy and interstitial bilateral pulmonary opacity. Worsening left lung ventilation since 04/13/2021, right lung is stable. Electronically Signed   By: Odessa Fleming M.D.   On: 04/18/2021 07:10    EKG: Sinus rhythm.  LVH with repolarization.  Inferior infarct, old.  Echo- 11/2020 1. Left ventricular ejection fraction, by estimation, is 25 to 30%. The  left ventricle has severely decreased function. The left ventricle  demonstrates global hypokinesis. The left ventricular internal cavity size  was mildly dilated. There is severe  left ventricular hypertrophy. Left ventricular diastolic parameters are  consistent with Grade II diastolic dysfunction (pseudonormalization).   2. Right ventricular systolic function is normal. The right ventricular  size is normal.   3. Left atrial size was  moderately dilated.   4. Right atrial size was moderately dilated.   5. The mitral valve is normal in structure. Moderate mitral valve  regurgitation.   6. Tricuspid valve regurgitation is moderate.   7. The aortic valve is normal in structure. Aortic valve regurgitation is  mild.   ASSESSMENT AND PLAN:  Ashvik Grundman is an 80 year old male with a history of HFrEF (EF 25 to 30%, moderate MR), hypertension, hyperlipidemia, CAD (40% LCx lesion, occluded RPL) who was admitted to the hospital with a CHF exacerbation.  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 Echo shows EF 25% with severe LVH; I wonder if he may have burned out HCM or amyloid cardiomyopathy. After a long discussion he explained that he is suffering and wants to go home with hospice. His only hesitation is that his family is not ready. I suspect he has weeks to months to live based on his nutritional status and severe cardiomyopathy and is  appropriate for such a referral. He does not like using lasix because it is hard for him to urinate so frequently.  - Continue Lasix while inpatient; on DC would provide with lasix 80 mg PO to take if he choses.  - Please engage palliative care/ hospice. He would like to go home ASAP.   Signed: Armando Reichert MD 05/16/2021, 11:49 AM

## 2021-05-16 NOTE — ED Notes (Signed)
Care transferred, report received from Serenity, RN 

## 2021-05-16 NOTE — Progress Notes (Signed)
*  PRELIMINARY RESULTS* Echocardiogram 2D Echocardiogram has been performed.  Todd Perez 05/16/2021, 11:47 AM

## 2021-05-16 NOTE — ED Notes (Signed)
Bladder scan read >780 on this pt at this time. MD Clyde Lundborg notified.

## 2021-05-16 NOTE — ED Notes (Signed)
Patient desat in the 80's after administering IV Morphine. O2/2L via nasal cannula applied. Sats maintained at 96-97%

## 2021-05-16 NOTE — ED Provider Notes (Addendum)
[facility] Emergency Department Provider Note  ____________________________________________   Event Date/Time   First MD Initiated Contact with Patient 05/16/21 0813     (approximate)  I have reviewed the triage vital signs and the nursing notes.   HISTORY  Chief Complaint Shortness of Breath (X weeks)    HPI Todd Perez is a 80 y.o. male with ischemic cardiomyopathy with a EF of 25 to 30%, hypertension who comes in with concerns for shortness of breath.  On review of records patient was admitted from 8/30-9/3 and was noted to have bilateral opacities with effusions, COVID positive.  Patient had to undergo bilateral thoracentesis which yielded 1 L and 1.1 L and was discharged back on home Lasix with close cardiology follow-up.  It appears the discharge note that below patient was on Lasix 20 mg twice daily.  To note patient is also on midodrine.  It appears that they switched him to torsemide and 1 unclear the exact dosage and patient's not sure of the dose either.  Patient reports having worsening shortness of breath over the past week as well as some worsening leg swelling, constant, not better with his fluid pills, nothing makes it worse.  Denies any fevers, abdominal pain.  Denies hitting his head or any other concerns.          Past Medical History:  Diagnosis Date   CAD (coronary artery disease)    cath in Nov 2019   CHF (congestive heart failure) (HCC)    Hyperlipidemia    Hypertension    Ischemic cardiomyopathy    EF 40%   Neuromuscular disorder (HCC)    Peripheral neuropathy     Patient Active Problem List   Diagnosis Date Noted   Protein-calorie malnutrition, severe 04/12/2021   Acute on chronic systolic (congestive) heart failure (HCC) 04/11/2021   NSTEMI (non-ST elevated myocardial infarction) (HCC) 04/10/2021   Pleural effusion due to CHF (congestive heart failure) (HCC) 04/10/2021   Shortness of breath 04/10/2021   COVID-19  virus infection 04/10/2021   Thrush 04/10/2021   Acute on chronic systolic CHF (congestive heart failure) (HCC) 11/23/2020   CAD (coronary artery disease)    Ischemic cardiomyopathy    Hypertension    Elevated troponin    Pneumonia 08/25/2018   CHF (congestive heart failure) (HCC) 04/18/2018    Past Surgical History:  Procedure Laterality Date   COLONOSCOPY WITH PROPOFOL N/A 02/06/2015   Procedure: COLONOSCOPY WITH PROPOFOL;  Surgeon: Wallace Cullens, MD;  Location: [facility] ENDOSCOPY;  Service: Gastroenterology;  Laterality: N/A;   LEFT HEART CATH AND CORONARY ANGIOGRAPHY Left 07/08/2018   Procedure: LEFT HEART CATH AND CORONARY ANGIOGRAPHY;  Surgeon: Dalia Heading, MD;  Location: [facility] INVASIVE CV LAB;  Service: Cardiovascular;  Laterality: Left;   thyroid nodule removal      Prior to Admission medications   Medication Sig Start Date End Date Taking? Authorizing Provider  albuterol (VENTOLIN HFA) 108 (90 Base) MCG/ACT inhaler Inhale 2 puffs into the lungs every 4 (four) hours as needed. 03/26/21   [provider]  allopurinol (ZYLOPRIM) 100 MG tablet Take 100 mg by mouth daily as needed (gout prevention).     [provider]  amoxicillin-clavulanate (AUGMENTIN) 875-125 MG tablet Take 1 tablet by mouth 2 (two) times daily. 04/18/21   Sharman Cheek, MD  aspirin 81 MG chewable tablet Chew 1 tablet (81 mg total) by mouth daily. 11/26/20   Zannie Cove, MD  feeding supplement (ENSURE ENLIVE / ENSURE PLUS)  LIQD Take 237 mLs by mouth 3 (three) times daily between meals. 04/14/21   Danford, Earl Lites, MD  furosemide (LASIX) 20 MG tablet Take 20 mg by mouth 2 (two) times daily. 01/25/21   [provider]  metoprolol succinate (TOPROL-XL) 25 MG 24 hr tablet Take 1 tablet (25 mg total) by mouth daily. 04/14/21   Danford, Earl Lites, MD  potassium chloride 20 MEQ TBCR Take 20 mEq by mouth daily. 11/25/20   Zannie Cove, MD  SPIRIVA RESPIMAT 2.5 MCG/ACT AERS Inhale 2 puffs  into the lungs daily. 04/02/21   [provider]  spironolactone (ALDACTONE) 25 MG tablet Take 25 mg by mouth daily. 02/15/21   [provider]  TRELEGY ELLIPTA 100-62.5-25 MCG/INH AEPB Inhale 1 puff into the lungs daily. 10/10/20   [provider]    Allergies Benicar hct [olmesartan medoxomil-hctz] and Lisinopril  Family History  Problem Relation Age of Onset   Kidney disease Mother    Emphysema Father    Lung cancer Father     Social History Social History   Tobacco Use   Smoking status: Former    Packs/day: 0.25    Years: 20.00    Pack years: 5.00    Types: Cigarettes   Smokeless tobacco: Never  Vaping Use   Vaping Use: Never used  Substance Use Topics   Alcohol use: Not Currently      Review of Systems Constitutional: No fever/chills Eyes: No visual changes. ENT: No sore throat. Cardiovascular: Denies chest pain. Respiratory: Positive shortness of breath Gastrointestinal: No abdominal pain.  No nausea, no vomiting.  No diarrhea.  No constipation. Genitourinary: Negative for dysuria. Musculoskeletal: Negative for back pain.  Positive leg swelling Skin: Negative for rash. Neurological: Negative for headaches, focal weakness or numbness. All other ROS negative ____________________________________________   PHYSICAL EXAM:  VITAL SIGNS: ED Triage Vitals [05/16/21 0812]  Enc Vitals Group     BP      Pulse      Resp      Temp      Temp src      SpO2 100 %     Weight      Height      Head Circumference      Peak Flow      Pain Score      Pain Loc      Pain Edu?      Excl. in GC?     Constitutional: Alert and oriented. Well appearing and in no acute distress.  Elderly male Eyes: Conjunctivae are normal. EOMI. Head: Atraumatic. Nose: No congestion/rhinnorhea. Mouth/Throat: Mucous membranes are moist.   Neck: No stridor. Trachea Midline. FROM Cardiovascular: Normal rate, regular rhythm. Grossly normal heart sounds.  Good  peripheral circulation. Respiratory: Normal respiratory effort.  Some mild increased work of breathing , crackles Gastrointestinal: Soft and nontender. No distention. No abdominal bruits.  Musculoskeletal: 1+ edema bilaterally.  No joint effusions. Neurologic:  Normal speech and language. No gross focal neurologic deficits are appreciated.  Skin:  Skin is warm, dry and intact. No rash noted. Psychiatric: Mood and affect are normal. Speech and behavior are normal. GU: Deferred   ____________________________________________   LABS (all labs ordered are listed, but only abnormal results are displayed)  Labs Reviewed  COMPREHENSIVE METABOLIC PANEL - Abnormal; Notable for the following components:      Result Value   Sodium 134 (*)    Chloride 93 (*)    CO2 34 (*)  Glucose, Bld 120 (*)    BUN 32 (*)    Creatinine, Ser 1.43 (*)    Calcium 8.8 (*)    Albumin 3.3 (*)    AST 49 (*)    ALT 49 (*)    Alkaline Phosphatase 127 (*)    Total Bilirubin 1.5 (*)    GFR, Estimated 50 (*)    All other components within normal limits  BRAIN NATRIURETIC PEPTIDE - Abnormal; Notable for the following components:   B Natriuretic Peptide 1,007.1 (*)    All other components within normal limits  TROPONIN I (HIGH SENSITIVITY) - Abnormal; Notable for the following components:   Troponin I (High Sensitivity) 538 (*)    All other components within normal limits  CBC WITH DIFFERENTIAL/PLATELET  URINALYSIS, ROUTINE W REFLEX MICROSCOPIC  TROPONIN I (HIGH SENSITIVITY)   ____________________________________________   ED ECG REPORT I, Concha Se, the attending physician, personally viewed and interpreted this ECG.  Sinus rate of 88, no ST elevations, T wave inversions in aVL V5 and V6, left bundle branch block.  Looks similar to prior ____________________________________________  RADIOLOGY Vela Prose, personally viewed and evaluated these images (plain radiographs) as part of my medical  decision making, as well as reviewing the written report by the radiologist.  ED MD interpretation: Pulmonary edema with moderate bilateral pleural effusion  Official radiology report(s): DG Chest 2 View  Result Date: 05/16/2021 CLINICAL DATA:  Pt reports SOB x a few weeks, got worse when Lasix was decreased last Friday. Bilat edema to LE EXAM: CHEST - 2 VIEW COMPARISON:  04/18/2021 FINDINGS: Cardiac silhouette mildly enlarged.  No mediastinal or hilar masses. Moderate bilateral pleural effusions. Bilateral interstitial thickening. Hazy and more confluent opacity noted at both lung bases. No pneumothorax. Skeletal structures are intact. IMPRESSION: 1. Findings consistent with congestive heart failure with interstitial and mild airspace edema and moderate bilateral pleural effusions. Electronically Signed   By: Amie Portland M.D.   On: 05/16/2021 08:53    ____________________________________________   PROCEDURES  Procedure(s) performed (including Critical Care):  .1-3 Lead EKG Interpretation Performed by: Concha Se, MD Authorized by: Concha Se, MD     Interpretation: normal     ECG rate:  80s   ECG rate assessment: normal     Rhythm: sinus rhythm     Ectopy: none     Conduction: normal     ____________________________________________   INITIAL IMPRESSION / ASSESSMENT AND PLAN / ED COURSE  Todd Perez was evaluated in Emergency Department on 05/16/2021 for the symptoms described in the history of present illness. He was evaluated in the context of the global COVID-19 pandemic, which necessitated consideration that the patient might be at risk for infection with the SARS-CoV-2 virus that causes COVID-19. Institutional protocols and algorithms that pertain to the evaluation of patients at risk for COVID-19 are in a state of rapid change based on information released by regulatory bodies including the CDC and federal and state organizations. These policies and algorithms were  followed during the patient's care in the ED.    Patient comes in with increasing shortness of breath in the setting of CHF and already going up on his diuretic by switching to torsemide.  Unclear what dose.  Patient looks fluid overloaded on exam.  Chest x-ray will be ordered to evaluate for any pneumonia, pulmonary edema.  Seems less likely pulmonary embolism given history of CHF and the swelling.  Patient denies any chest pain but EKG  and cardiac markers were ordered and patient will be kept on the cardiac monitor to evaluate for any ACS.  Patient is afebrile well-appearing seems less likely to be pneumonia.  Troponin elevated but patient is chronically elevated.  We will need to be continued to be trended out but patient has no chest pain at this time.  Most likely just secondary to his CHF.   BNP is elevated similarly when he needed the admission previously his chest x-ray shows moderate pleural effusions and pulmonary edema which I suspect is the cause of his shortness of breath.  We will give a dose of IV Lasix.  He does starting to have some evidence of cardiorenal disorder given his elevated creatinine this will need to be closely monitored therefore I feel like admission will be best for patient given he is already failed increasing to torsemide.  Patient and daughter is at bedside who are agreement with the plan      ____________________________________________   FINAL CLINICAL IMPRESSION(S) / ED DIAGNOSES   Final diagnoses:  Acute on chronic congestive heart failure, unspecified heart failure type (HCC)  Cardiorenal disease      MEDICATIONS GIVEN DURING THIS VISIT:  Medications  furosemide (LASIX) injection 80 mg (80 mg Intravenous Given 05/16/21 0936)     ED Discharge Orders     None        Note:  This document was prepared using Dragon voice recognition software and may include unintentional dictation errors.    Concha Se, MD 05/16/21 3244    Concha Se, MD 05/16/21 1004

## 2021-05-16 NOTE — ED Notes (Signed)
Critical result of Troponin of 538 given to MD Funke at this moment.

## 2021-05-17 DIAGNOSIS — I509 Heart failure, unspecified: Secondary | ICD-10-CM

## 2021-05-17 DIAGNOSIS — I5043 Acute on chronic combined systolic (congestive) and diastolic (congestive) heart failure: Secondary | ICD-10-CM

## 2021-05-17 DIAGNOSIS — Z515 Encounter for palliative care: Secondary | ICD-10-CM

## 2021-05-17 LAB — HEMOGLOBIN A1C
Hgb A1c MFr Bld: 6.7 % — ABNORMAL HIGH (ref 4.8–5.6)
Mean Plasma Glucose: 146 mg/dL

## 2021-05-17 LAB — BASIC METABOLIC PANEL
Anion gap: 5 (ref 5–15)
BUN: 36 mg/dL — ABNORMAL HIGH (ref 8–23)
CO2: 39 mmol/L — ABNORMAL HIGH (ref 22–32)
Calcium: 8.7 mg/dL — ABNORMAL LOW (ref 8.9–10.3)
Chloride: 94 mmol/L — ABNORMAL LOW (ref 98–111)
Creatinine, Ser: 1.47 mg/dL — ABNORMAL HIGH (ref 0.61–1.24)
GFR, Estimated: 48 mL/min — ABNORMAL LOW (ref >60)
Glucose, Bld: 124 mg/dL — ABNORMAL HIGH (ref 70–99)
Potassium: 4.4 mmol/L (ref 3.5–5.1)
Sodium: 138 mmol/L (ref 135–145)

## 2021-05-17 LAB — LIPID PANEL
Cholesterol: 124 mg/dL (ref 0–200)
HDL: 67 mg/dL (ref >40)
LDL Cholesterol: 48 mg/dL (ref 0–99)
Total CHOL/HDL Ratio: 1.9 RATIO
Triglycerides: 46 mg/dL (ref <150)
VLDL: 9 mg/dL (ref 0–40)

## 2021-05-17 LAB — MAGNESIUM: Magnesium: 2.1 mg/dL (ref 1.7–2.4)

## 2021-05-17 MED ORDER — LORAZEPAM 0.5 MG PO TABS: 0.5000 mg | ORAL_TABLET | Freq: Four times a day (QID) | ORAL | 0 refills | Status: AC | PRN

## 2021-05-17 MED ORDER — MORPHINE SULFATE 20 MG/5ML PO SOLN: 2.5000 mg | ORAL | 0 refills | Status: AC | PRN

## 2021-05-17 MED ORDER — UMECLIDINIUM BROMIDE 62.5 MCG/INH IN AEPB
1.0000 | INHALATION_SPRAY | Freq: Every day | RESPIRATORY_TRACT | Status: DC
  Filled 2021-05-17: qty 7

## 2021-05-17 MED ORDER — FLUTICASONE FUROATE-VILANTEROL 100-25 MCG/INH IN AEPB
1.0000 | INHALATION_SPRAY | Freq: Every day | RESPIRATORY_TRACT | Status: DC
  Filled 2021-05-17: qty 28

## 2021-05-17 NOTE — Progress Notes (Signed)
SATURATION QUALIFICATIONS: (This note is used to comply with regulatory documentation for home oxygen)  Patient Saturations on Room Air at Rest = 94%  Patient Saturations on Room Air while Ambulating = 88%  Patient Saturations on n/a Liters of oxygen while Ambulating = n/a%  Please briefly explain why patient needs home oxygen:  Patient desats on room air with no exertion and feels short of breath.

## 2021-05-17 NOTE — TOC Initial Note (Signed)
Transition of Care [facility]) - Initial/Assessment Note    Patient Details  Name: Todd Perez MRN: [id number] Date of Birth: [birth year]  Transition of Care [facility]) CM/SW Contact:    Marina Goodell Phone Number: [phone] 05/17/2021, 9:41 AM  Clinical Narrative:                  Patient presents to [facility] ED from home due to SOB for few weeks, bi-lateral lower leg swelling.  Patient's main contact is his spouse Todd Perez [phone] .  The patient would like hospice services to return home, and does not have a preferred hospice provider.  CSW contacted hospice, and Hospice RN from [facility] will speak with the patient and his family today for discharge back home with hospice. Attending and ED Staff updated.   Expected Discharge Plan: Home w Hospice Care Barriers to Discharge: No Barriers Identified   Patient Goals and CMS Choice Patient states their goals for this hospitalization and ongoing recovery are:: I want to return home with hospice. CMS Medicare.gov Compare Post Acute Care list provided to:: Other (Comment Required) Choice offered to / list presented to : Spouse Todd Perez (Spouse)   [phone] (Home Phone))  Expected Discharge Plan and Services Expected Discharge Plan: Home w Hospice Care In-house Referral: Clinical Social Work   Post Acute Care Choice: Hospice Living arrangements for the past 2 months: Single Family Home                                      Prior Living Arrangements/Services Living arrangements for the past 2 months: Single Family Home Lives with:: Spouse Todd Perez, Todd Perez (Spouse)   [phone] (Home Phone)) Patient language and need for interpreter reviewed:: Yes Do you feel safe going back to the place where you live?: Yes      Need for Family Participation in Patient Care: Yes (Comment) Care giver support system in place?: Yes (comment)   Criminal Activity/Legal Involvement Pertinent to Current  Situation/Hospitalization: No - Comment as needed  Activities of Daily Living      Permission Sought/Granted Permission sought to share information with : Facility Industrial/product designer granted to share information with : Yes, Verbal Permission Granted  Share Information with NAME: Todd Perez, Todd Perez (Spouse)   [phone] (Home Phone)  Permission granted to share info w AGENCY: [facility]        Emotional Assessment Appearance:: Appears stated age Attitude/Demeanor/Rapport: Engaged Affect (typically observed): Stable Orientation: : Oriented to Self, Oriented to Place, Oriented to  Time, Oriented to Situation Alcohol / Substance Use: Not Applicable Psych Involvement: No (comment)  Admission diagnosis:  Acute on chronic combined systolic and diastolic CHF (congestive heart failure) (HCC) [I50.43] Acute decompensated heart failure (HCC) [I50.9] Patient Active Problem List   Diagnosis Date Noted   Acute decompensated heart failure (HCC) 05/17/2021   Acute on chronic combined systolic and diastolic CHF (congestive heart failure) (HCC) 05/16/2021   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 (HCC) 05/16/2021   Bilateral pleural effusion 05/16/2021   COPD (chronic obstructive pulmonary disease) (HCC) 05/16/2021   Protein-calorie malnutrition, severe 04/12/2021   Acute on chronic systolic (congestive) heart failure (HCC) 04/11/2021   NSTEMI (non-ST elevated myocardial infarction) (HCC) 04/10/2021   Pleural effusion due to CHF (congestive heart failure) (HCC) 04/10/2021   Shortness of breath 04/10/2021   COVID-19 virus infection 04/10/2021   Thrush 04/10/2021   Acute  on chronic systolic CHF (congestive heart failure) (HCC) 11/23/2020   CAD (coronary artery disease)    Ischemic cardiomyopathy    Hypertension    Elevated troponin    Pneumonia 08/25/2018   CHF (congestive heart failure) (HCC) 04/18/2018   PCP:  [name],  MD Pharmacy:   [facility] [address], [place] - [address] [place] [address] Phone: [phone] Fax: [phone]     Social Determinants of Health (SDOH) Interventions    Readmission Risk Interventions No flowsheet data found.

## 2021-05-17 NOTE — Progress Notes (Signed)
Palliative:  Referral received. Per chart review patient has requested hospice. TOC referred to hospice. No need for palliative involvement - requested hospice liaison notify PMT if additional needs arise or goals are unclear.  Todd Ren, DNP, AGNP-C Palliative Medicine Team Team Phone # [phone]  Pager # [phone]  NO CHARGE

## 2021-05-17 NOTE — ED Notes (Addendum)
E-signature pad unavailable - Pt  & Daughter verbalized understanding of D/C information - no additional concerns at this time.  Pts O2 tank provided to the daughter - education provided

## 2021-05-17 NOTE — Progress Notes (Signed)
Discharge instructions given to patient and daughter. Verbalized understanding. No acute distress at this time. Patient dressed to leave hospital. IV removed. Awaiting O2 to be delivered to hospital for patient to discharge. Daughter will transport home.

## 2021-05-17 NOTE — Discharge Summary (Signed)
Physician Discharge Summary  Todd Perez TKW:[id number] DOB: 1941/05/06 DOA: 05/16/2021  PCP: [name], MD  Admit date: 05/16/2021 Discharge date: 05/17/2021  Admitted From: Home Disposition: Home with hospice services  Recommendations for Outpatient Follow-up:  For outpatient hospice provider   Home Health: No Equipment/Devices: Oxygen 2 L via nasal cannula  Discharge Condition: Hospice CODE STATUS: DNR Diet recommendation: Comfort  Brief/Interim Summary: 80 y.o. male with medical history significant CHF with EF 25-30%, COPD, hypertension, hyperlipidemia, gout, CAD, non-STEMI, peripheral neuropathy, CKD-3, recent COVID infection 04/10/2021, who presents with shortness breath.   Patient was recently hospitalized from 8/30-9/3 due to COVID infection.  Patient also had bilateral pleural effusion.  Patient underwent bilateral thoracentesis in previous admission. He states that his shortness has been worsening for more than a week.  He has some cough with little mucus production.  Denies chest pain, fever or chills.  Patient is constipated, no nausea vomiting, diarrhea or abdominal pain for no symptoms of UTI. He also has bilateral leg edema which is chronic issue per patient.  He was recently switched from Lasix to torsemide for CHF.   My evaluation the patient is resting comfortably in bed.  He is frail.  He is not overtly dyspneic on my evaluation.  His voice is weak.  We had a lengthy discussion regarding hospice services and philosophy.  I asked him to verify that this is something he was interested and he said yes.  At this point I engaged palliative care as well as hospice liaison.  Hospice liaison did evaluate and deemed patient appropriate for return home with hospice services.  Hospice services will initiate on 10/7.  Patient would like to go home today.  He is discharge home once hospice services has been engaged.  I sent prescriptions for oral liquid morphine and Ativan to his  local outpatient pharmacy.  He is discharged home with hospice service to start on 10/7.    Discharge Diagnoses:  Principal Problem:   Acute on chronic combined systolic and diastolic CHF (congestive heart failure) (HCC) Active Problems:   CAD (coronary artery disease)   Hypertension   Elevated troponin   Protein-calorie malnutrition, severe   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 (HCC)   Bilateral pleural effusion   COPD (chronic obstructive pulmonary disease) (HCC)   Acute decompensated heart failure [facility]   Hospice care patient   Palliative care patient  Time of discharge consolidated medications to focus on patient comfort.  All risk factor modifying medications were held at time of discharge.  Defer restarting these medications to hospice staff.   Discharge Instructions  Discharge Instructions     Diet - low sodium heart healthy   Complete by: As directed    Increase activity slowly   Complete by: As directed       Allergies as of 05/17/2021       Reactions   Benicar Hct [olmesartan Medoxomil-hctz] Itching   Lisinopril Cough        Medication List     STOP taking these medications    amoxicillin-clavulanate 875-125 MG tablet Commonly known as: Augmentin   aspirin 81 MG chewable tablet   Potassium Chloride ER 20 MEQ Tbcr   predniSONE 10 MG tablet Commonly known as: DELTASONE   sulfamethoxazole-trimethoprim 400-80 MG tablet Commonly known as: BACTRIM       TAKE these medications    albuterol 108 (90 Base) MCG/ACT inhaler Commonly known as: VENTOLIN HFA Inhale 2 puffs into  the lungs every 4 (four) hours as needed.   allopurinol 100 MG tablet Commonly known as: ZYLOPRIM Take 100 mg by mouth daily as needed (gout prevention).   feeding supplement Liqd Take 237 mLs by mouth 3 (three) times daily between meals.   furosemide 20 MG tablet Commonly known as: LASIX Take 20 mg by mouth 2 (two) times daily.    LORazepam 0.5 MG tablet Commonly known as: Ativan Take 1 tablet (0.5 mg total) by mouth every 6 (six) hours as needed for up to 3 days for anxiety.   metoprolol succinate 25 MG 24 hr tablet Commonly known as: TOPROL-XL Take 1 tablet (25 mg total) by mouth daily.   midodrine 10 MG tablet Commonly known as: PROAMATINE Take 10 mg by mouth 3 (three) times daily.   morphine 20 MG/5ML solution Take 0.6 mLs (2.4 mg total) by mouth every 2 (two) hours as needed for up to 3 days for pain.   nystatin 100000 UNIT/ML suspension Commonly known as: MYCOSTATIN Take 5 mLs by mouth 4 (four) times daily.   Spiriva Respimat 2.5 MCG/ACT Aers Generic drug: Tiotropium Bromide Monohydrate Inhale 2 puffs into the lungs daily.   spironolactone 25 MG tablet Commonly known as: ALDACTONE Take 25 mg by mouth daily.   tamsulosin 0.4 MG Caps capsule Commonly known as: FLOMAX Take 0.4 mg by mouth daily.   torsemide 100 MG tablet Commonly known as: DEMADEX Take 100 mg by mouth daily.   Trelegy Ellipta 100-62.5-25 MCG/INH Aepb Generic drug: Fluticasone-Umeclidin-Vilant Inhale 1 puff into the lungs daily.               Durable Medical Equipment  (From admission, onward)           Start     Ordered   05/17/21 1237  For home use only DME oxygen  Once       Question Answer Comment  Length of Need Lifetime   Mode or (Route) Nasal cannula   Liters per Minute 2   Frequency Continuous (stationary and portable oxygen unit needed)   Oxygen conserving device Yes   Oxygen delivery system Gas      05/17/21 1236            Allergies  Allergen Reactions   Benicar Hct [Olmesartan Medoxomil-Hctz] Itching   Lisinopril Cough    Consultations: Palliative care   Procedures/Studies: DG Chest 2 View  Result Date: 05/16/2021 CLINICAL DATA:  Pt reports SOB x a few weeks, got worse when Lasix was decreased last Friday. Bilat edema to LE EXAM: CHEST - 2 VIEW COMPARISON:  04/18/2021  FINDINGS: Cardiac silhouette mildly enlarged.  No mediastinal or hilar masses. Moderate bilateral pleural effusions. Bilateral interstitial thickening. Hazy and more confluent opacity noted at both lung bases. No pneumothorax. Skeletal structures are intact. IMPRESSION: 1. Findings consistent with congestive heart failure with interstitial and mild airspace edema and moderate bilateral pleural effusions. Electronically Signed   By: Amie Portland M.D.   On: 05/16/2021 08:53   CT Chest Wo Contrast  Result Date: 04/18/2021 CLINICAL DATA:  Respiratory illness, nondiagnostic xray. Hemoptysis, shortness of breath. History of CHF and COVID-19. Left thoracentesis on 04/13/2021. History of left pneumothorax. EXAM: CT CHEST WITHOUT CONTRAST TECHNIQUE: Multidetector CT imaging of the chest was performed following the standard protocol without IV contrast. COMPARISON:  CT 11/20/2020 FINDINGS: Cardiovascular: Moderate cardiomegaly. Trace pericardial effusion. Stable ascending thoracic aortic aneurysm measuring approximately 4.2 cm in diameter. Atherosclerotic calcifications of the aorta and coronary arteries. Main  pulmonary trunk measures 3.5 cm in diameter. Mediastinum/Nodes: Stable mildly prominent precarinal lymph node. No axillary lymphadenopathy. Evaluation of the hilar structures is limited in the absence of intravenous contrast. Within this limitation, no obvious hilar adenopathy or mass is identified. No acute abnormality is identified involving the thyroid, trachea, or esophagus. Lungs/Pleura: Moderate right and small left pleural effusions with associated compressive atelectasis. Patchy consolidations within the left lower lobe and lingula as well as within the right lower lobe. Patchy areas of predominantly ground-glass opacities within the bilateral upper lobes. Trace left apical pneumothorax. No right-sided pneumothorax. Upper Abdomen: Ovoid calcifications within the upper pole of the left kidney measuring up to  1.2 cm in diameter. No acute findings within the included upper abdomen. Musculoskeletal: No chest wall mass or suspicious bone lesions identified. IMPRESSION: 1. Persistent trace left apical pneumothorax. 2. Moderate right and small left pleural effusions with associated compressive atelectasis. 3. Patchy consolidations within the left lower lobe and lingula as well as within the right lower lobe. Patchy areas of predominantly ground-glass opacities within the bilateral upper lobes. Findings may represent pulmonary edema versus multifocal pneumonia. 4. Moderate cardiomegaly with trace pericardial effusion. 5. Dilated main pulmonary trunk suggesting pulmonary arterial hypertension. 6. 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 Recommend annual imaging followup by CTA or MRA. This recommendation follows 2010 ACCF/AHA/AATS/ACR/ASA/SCA/SCAI/SIR/STS/SVM Guidelines for the Diagnosis and Management of Patients with Thoracic Aortic Disease. Circulation. 2010; 121: N562-Z308. Aortic aneurysm NOS (ICD10-I71.9) Aortic Atherosclerosis (ICD10-I70.0). Electronically Signed   By: Duanne Guess D.O.   On: 04/18/2021 09:12   DG Chest Portable 1 View  Result Date: 04/18/2021 CLINICAL DATA:  80 year old male with shortness of breath and hemoptysis. Recently hospitalized for CHF and COVID. EXAM: PORTABLE CHEST 1 VIEW COMPARISON:  Portable chest 04/14/2021 and earlier. FINDINGS: Portable AP semi upright view at 0651 hours. Possible trace residual left pneumothorax. Skin fold artifacts also noted at the chest. Stable lung volumes. Mixed layering and loculated left and layering right pleural effusions with coarse and patchy bilateral interstitial and basilar opacity. Stable cardiac size and mediastinal contours. Visualized tracheal air column is within normal limits. Ventilation in the left lung has decreased since 04/13/2021. The right lung is stable. No acute osseous abnormality identified.  Negative visible bowel gas pattern. IMPRESSION: 1. Possible trace continued left pneumothorax. 2. Bilateral pleural effusions, partially loculated on the left, with patchy and interstitial bilateral pulmonary opacity. Worsening left lung ventilation since 04/13/2021, right lung is stable. Electronically Signed   By: Odessa Fleming M.D.   On: 04/18/2021 07:10   ECHOCARDIOGRAM COMPLETE  Result Date: 05/16/2021    ECHOCARDIOGRAM REPORT   Patient Name:   LAUTARO KORAL Date of Exam: 05/16/2021 Medical Rec #:  [id number]      Height:       73.0 in Accession #:    [id number]     Weight:       138.9 lb Date of Birth:  Feb 01, 1941     BSA:          1.843 m Patient Age:    79 years       BP:           105/67 mmHg Patient Gender: M              HR:           82 bpm. Exam Location:  [facility] Procedure: 2D Echo, Color Doppler and Cardiac Doppler Indications:     I50.21  congestive heart failure-Acute Systolic  History:         Patient has prior history of Echocardiogram examinations, most                  recent 11/23/2020. CHF, CAD, Signs/Symptoms:Shortness of Breath;                  Risk Factors:Hypertension and Dyslipidemia.  Sonographer:     Humphrey Rolls Referring Phys:  Wynona Neat NIU Diagnosing Phys: Sena Slate  Sonographer Comments: Suboptimal subcostal window. TDS due to pt in Fowler's position due to extreme SOB. IMPRESSIONS  1. Left ventricular ejection fraction, by estimation, is 25 to 30%. The left ventricle has severely decreased function. The left ventricle demonstrates global hypokinesis. There is severe left ventricular hypertrophy. Left ventricular diastolic parameters are consistent with Grade II diastolic dysfunction (pseudonormalization).  2. Right ventricular systolic function is moderately reduced. The right ventricular size is moderately enlarged.  3. Left atrial size was severely dilated.  4. Right atrial size was mildly dilated.  5. A small pericardial effusion is present. Large pleural effusion in both left and  right lateral regions.  6. The mitral valve is degenerative. Mild mitral valve regurgitation. No evidence of mitral stenosis.  7. The aortic valve is tricuspid. Aortic valve regurgitation is mild. Mild to moderate aortic valve sclerosis/calcification is present, without any evidence of aortic stenosis.  8. Aortic dilatation noted. There is moderate dilatation of the aortic root, measuring 45 mm. There is moderate dilatation of the ascending aorta, measuring 43 mm. FINDINGS  Left Ventricle: Left ventricular ejection fraction, by estimation, is 25 to 30%. The left ventricle has severely decreased function. The left ventricle demonstrates global hypokinesis. The left ventricular internal cavity size was small. There is severe  left ventricular hypertrophy. Left ventricular diastolic parameters are consistent with Grade II diastolic dysfunction (pseudonormalization). Right Ventricle: The right ventricular size is moderately enlarged. Right vetricular wall thickness was not well visualized. Right ventricular systolic function is moderately reduced. Left Atrium: Left atrial size was severely dilated. Right Atrium: Right atrial size was mildly dilated. Pericardium: A small pericardial effusion is present. Mitral Valve: The mitral valve is degenerative in appearance. Mild mitral annular calcification. Mild mitral valve regurgitation. No evidence of mitral valve stenosis. MV peak gradient, 4.0 mmHg. The mean mitral valve gradient is 2.0 mmHg. Tricuspid Valve: The tricuspid valve is grossly normal. Tricuspid valve regurgitation is trivial. Aortic Valve: The aortic valve is tricuspid. Aortic valve regurgitation is mild. Mild to moderate aortic valve sclerosis/calcification is present, without any evidence of aortic stenosis. Aortic valve mean gradient measures 1.0 mmHg. Aortic valve peak gradient measures 2.0 mmHg. Aortic valve area, by VTI measures 2.48 cm. Pulmonic Valve: The pulmonic valve was grossly normal. Pulmonic  valve regurgitation is trivial. No evidence of pulmonic stenosis. Aorta: Aortic dilatation noted. There is moderate dilatation of the aortic root, measuring 45 mm. There is moderate dilatation of the ascending aorta, measuring 43 mm. IAS/Shunts: The interatrial septum was not well visualized. Additional Comments: There is a large pleural effusion in both left and right lateral regions.  LEFT VENTRICLE PLAX 2D LVIDd:         3.70 cm LVIDs:         3.30 cm LV PW:         1.90 cm LV IVS:        1.90 cm LVOT diam:     1.90 cm LV SV:  29 LV SV Index:   16 LVOT Area:     2.84 cm  RIGHT VENTRICLE RV Basal diam:  4.60 cm LEFT ATRIUM             Index       RIGHT ATRIUM           Index LA diam:        4.60 cm 2.50 cm/m  RA Area:     18.50 cm LA Vol (A2C):   90.8 ml 49.27 ml/m RA Volume:   59.20 ml  32.13 ml/m LA Vol (A4C):   91.2 ml 49.49 ml/m LA Biplane Vol: 92.6 ml 50.25 ml/m  AORTIC VALVE                   PULMONIC VALVE AV Area (Vmax):    2.50 cm    PV Vmax:       0.65 m/s AV Area (Vmean):   2.51 cm    PV Vmean:      47.200 cm/s AV Area (VTI):     2.48 cm    PV VTI:        0.109 m AV Vmax:           71.00 cm/s  PV Peak grad:  1.7 mmHg AV Vmean:          50.100 cm/s PV Mean grad:  1.0 mmHg AV VTI:            0.119 m AV Peak Grad:      2.0 mmHg AV Mean Grad:      1.0 mmHg LVOT Vmax:         62.70 cm/s LVOT Vmean:        44.400 cm/s LVOT VTI:          0.104 m LVOT/AV VTI ratio: 0.87  AORTA Ao Root diam: 4.50 cm MITRAL VALVE MV Area (PHT): 5.06 cm    SHUNTS MV Area VTI:   1.42 cm    Systemic VTI:  0.10 m MV Peak grad:  4.0 mmHg    Systemic Diam: 1.90 cm MV Mean grad:  2.0 mmHg MV Vmax:       1.00 m/s MV Vmean:      56.4 cm/s MV Decel Time: 150 msec MV E velocity: 99.80 cm/s MV A velocity: 63.40 cm/s MV E/A ratio:  1.57 Sena Slate Electronically signed by Sena Slate Signature Date/Time: 05/16/2021/12:23:28 PM    Final       Subjective: Seen and examined on the day of discharge.  Patient is stable from a  hospital standpoint to discharge home with hospice services.  Discharge Exam: Vitals:   05/17/21 1100 05/17/21 1200  BP: 97/72 92/69  Pulse: 79 78  Resp: 20 16  Temp:    SpO2: 99% 100%   Vitals:   05/17/21 0900 05/17/21 1006 05/17/21 1100 05/17/21 1200  BP: 104/74  97/72 92/69  Pulse:   79 78  Resp: Temp:  97.6 F (36.4 C)    TempSrc:  Oral    SpO2:   99% 100%  Weight:      Height:        General: Pt is alert, awake, not in acute distress Cardiovascular: RRR, S1/S2 +, no rubs, no gallops Respiratory: CTA bilaterally, no wheezing, no rhonchi Abdominal: Soft, NT, ND, bowel sounds + Extremities: no edema, no cyanosis    The results of significant diagnostics from this hospitalization (including imaging, microbiology, ancillary and  laboratory) are listed below for reference.     Microbiology: Recent Results (from the past 240 hour(s))  Resp Panel by RT-PCR (Flu A&B, Covid) Nasopharyngeal Swab     Status: Abnormal   Collection Time: 05/16/21  6:07 PM   Specimen: Nasopharyngeal Swab; Nasopharyngeal(NP) swabs in vial transport medium  Result Value Ref Range Status   SARS Coronavirus 2 by RT PCR POSITIVE (A) NEGATIVE Final    Comment: READ BACK AND VERIFIED BY Elvera Maria, RN AT [phone] 05/16/21 [facility] (NOTE) SARS-CoV-2 target nucleic acids are DETECTED.  The SARS-CoV-2 RNA is generally detectable in upper respiratory specimens during the acute phase of infection. Positive results are indicative of the presence of the identified virus, but do not rule out bacterial infection or co-infection with other pathogens not detected by the test. Clinical correlation with patient history and other diagnostic information is necessary to determine patient infection status. The expected result is Negative.  Fact Sheet for Patients: BloggerCourse.com  Fact Sheet for Healthcare Providers: SeriousBroker.it  This test is not yet  approved or cleared by the [place] FDA and  has been authorized for detection and/or diagnosis of SARS-CoV-2 by FDA under an Emergency Use Authorization (EUA).  This EUA will remain in effect (meaning this test can be used) for the duratio n of  the COVID-19 declaration under Section 564(b)(1) of the Act, 21 U.S.C. section 360bbb-3(b)(1), unless the authorization is terminated or revoked sooner.     Influenza A by PCR NEGATIVE NEGATIVE Final   Influenza B by PCR NEGATIVE NEGATIVE Final    Comment: (NOTE) The Xpert Xpress SARS-CoV-2/FLU/RSV plus assay is intended as an aid in the diagnosis of influenza from Nasopharyngeal swab specimens and should not be used as a sole basis for treatment. Nasal washings and aspirates are unacceptable for Xpert Xpress SARS-CoV-2/FLU/RSV testing.  Fact Sheet for Patients: BloggerCourse.com  Fact Sheet for Healthcare Providers: SeriousBroker.it  This test is not yet approved or cleared by the [place] FDA and has been authorized for detection and/or diagnosis of SARS-CoV-2 by FDA under an Emergency Use Authorization (EUA). This EUA will remain in effect (meaning this test can be used) for the duration of the COVID-19 declaration under Section 564(b)(1) of the Act, 21 U.S.C. section 360bbb-3(b)(1), unless the authorization is terminated or revoked.  Performed at [facility], [address] [place], [place] [address]      Labs: BNP (last 3 results) Recent Labs    11/23/20 0808 04/10/21 1500 05/16/21 0823  BNP 782.8* 1,127.9* 1,007.1*   Basic Metabolic Panel: Recent Labs  Lab 05/16/21 0823 05/17/21 0605  NA 134* 138  K 4.0 4.4  CL 93* 94*  CO2 34* 39*  GLUCOSE 120* 124*  BUN 32* 36*  CREATININE 1.43* 1.47*  CALCIUM 8.8* 8.7*  MG  --  2.1   Liver Function Tests: Recent Labs  Lab 05/16/21 0823  AST 49*  ALT 49*  ALKPHOS 127*  BILITOT 1.5*  PROT 7.4   ALBUMIN 3.3*   No results for input(s): LIPASE, AMYLASE in the last 168 hours. No results for input(s): AMMONIA in the last 168 hours. CBC: Recent Labs  Lab 05/16/21 0823  WBC 5.7  NEUTROABS 3.8  HGB 14.9  HCT 43.6  MCV 95.8  PLT 214   Cardiac Enzymes: No results for input(s): CKTOTAL, CKMB, CKMBINDEX, TROPONINI in the last 168 hours. BNP: Invalid input(s): POCBNP CBG: No results for input(s): GLUCAP in the last 168 hours. D-Dimer No results for input(s):  DDIMER in the last 72 hours. Hgb A1c Recent Labs    05/16/21 1427  HGBA1C 6.7*   Lipid Profile Recent Labs    05/17/21 0605  CHOL 124  HDL 67  LDLCALC 48  TRIG 46  CHOLHDL 1.9   Thyroid function studies No results for input(s): TSH, T4TOTAL, T3FREE, THYROIDAB in the last 72 hours.  Invalid input(s): FREET3 Anemia work up No results for input(s): VITAMINB12, FOLATE, FERRITIN, TIBC, IRON, RETICCTPCT in the last 72 hours. Urinalysis    Component Value Date/Time   COLORURINE YELLOW (A) 05/16/2021 1415   APPEARANCEUR CLEAR (A) 05/16/2021 1415   LABSPEC 1.010 05/16/2021 1415   PHURINE 7.0 05/16/2021 1415   GLUCOSEU NEGATIVE 05/16/2021 1415   HGBUR SMALL (A) 05/16/2021 1415   BILIRUBINUR NEGATIVE 05/16/2021 1415   KETONESUR NEGATIVE 05/16/2021 1415   PROTEINUR NEGATIVE 05/16/2021 1415   NITRITE NEGATIVE 05/16/2021 1415   LEUKOCYTESUR NEGATIVE 05/16/2021 1415   Sepsis Labs Invalid input(s): PROCALCITONIN,  WBC,  LACTICIDVEN Microbiology Recent Results (from the past 240 hour(s))  Resp Panel by RT-PCR (Flu A&B, Covid) Nasopharyngeal Swab     Status: Abnormal   Collection Time: 05/16/21  6:07 PM   Specimen: Nasopharyngeal Swab; Nasopharyngeal(NP) swabs in vial transport medium  Result Value Ref Range Status   SARS Coronavirus 2 by RT PCR POSITIVE (A) NEGATIVE Final    Comment: READ BACK AND VERIFIED BY Elvera Maria, RN AT 1903 05/16/21 [facility] (NOTE) SARS-CoV-2 target nucleic acids are DETECTED.  The  SARS-CoV-2 RNA is generally detectable in upper respiratory specimens during the acute phase of infection. Positive results are indicative of the presence of the identified virus, but do not rule out bacterial infection or co-infection with other pathogens not detected by the test. Clinical correlation with patient history and other diagnostic information is necessary to determine patient infection status. The expected result is Negative.  Fact Sheet for Patients: BloggerCourse.com  Fact Sheet for Healthcare Providers: SeriousBroker.it  This test is not yet approved or cleared by the [place] FDA and  has been authorized for detection and/or diagnosis of SARS-CoV-2 by FDA under an Emergency Use Authorization (EUA).  This EUA will remain in effect (meaning this test can be used) for the duratio n of  the COVID-19 declaration under Section 564(b)(1) of the Act, 21 U.S.C. section 360bbb-3(b)(1), unless the authorization is terminated or revoked sooner.     Influenza A by PCR NEGATIVE NEGATIVE Final   Influenza B by PCR NEGATIVE NEGATIVE Final    Comment: (NOTE) The Xpert Xpress SARS-CoV-2/FLU/RSV plus assay is intended as an aid in the diagnosis of influenza from Nasopharyngeal swab specimens and should not be used as a sole basis for treatment. Nasal washings and aspirates are unacceptable for Xpert Xpress SARS-CoV-2/FLU/RSV testing.  Fact Sheet for Patients: BloggerCourse.com  Fact Sheet for Healthcare Providers: SeriousBroker.it  This test is not yet approved or cleared by the [place] FDA and has been authorized for detection and/or diagnosis of SARS-CoV-2 by FDA under an Emergency Use Authorization (EUA). This EUA will remain in effect (meaning this test can be used) for the duration of the COVID-19 declaration under Section 564(b)(1) of the Act, 21 U.S.C. section  360bbb-3(b)(1), unless the authorization is terminated or revoked.  Performed at [facility], [address] [place], [place] [address]      Time coordinating discharge: Over 30 minutes  SIGNED:   Tresa Moore, MD  [facility] 05/17/2021, 3:10 PM Pager   If 7PM-7AM,  please contact night-coverage

## 2021-05-18 ENCOUNTER — Other Ambulatory Visit: Payer: Self-pay | Admitting: Pulmonary Disease

## 2021-05-18 DIAGNOSIS — J910 Malignant pleural effusion: Secondary | ICD-10-CM

## 2021-05-18 DIAGNOSIS — J90 Pleural effusion, not elsewhere classified: Secondary | ICD-10-CM

## 2021-05-23 ENCOUNTER — Ambulatory Visit: Payer: Medicare Other

## 2021-05-27 NOTE — Progress Notes (Deleted)
   Patient ID: Todd Perez, male    DOB: [birth year], 80 y.o.   MRN: [id number]  HPI  Mr Mapel is a 80 y/o male with a history of  Echo report from 05/16/21 reviewed and showed an EF of 25-30% along with mild MR.   Was in the ED 05/16/21 due to acute on chronic HF, bilateral pleural effusions and covid +. Had bilateral thoracentesis which yielded 1L and 1.1 L. Treated with diuretics and midodrine.   He presents today for his initial visit with a chief complaint of  Review of Systems    Physical Exam     Assessment & Plan:  1: Chronic heart failure with reduced ejection fraction- - NYHA class - saw cardiology (Fath) 03/19/21 - saw pulmonology Karna Christmas) 05/11/21 with referral placed to [facility] - BNP 05/16/21 was 1007.1  2: HTN- - BP - saw PCP Shelton Silvas) 05/03/21   3: CKD- - BMP 05/17/21 reviewed and showed sodium 138, potassium 4.4, creatinine 1.47 & GFR 48

## 2021-05-29 ENCOUNTER — Ambulatory Visit: Payer: Medicare Other | Admitting: Family

## 2021-07-12 DEATH — deceased

## 2022-09-08 IMAGING — DX DG CHEST 1V PORT
2 series · 2 of 2 positions shown · non-contrast
Comparison: 07/24/2020

CLINICAL DATA: 79-year-old male status post thoracentesis

EXAM:
PORTABLE CHEST 1 VIEW

[chest ap (1 of 2)]
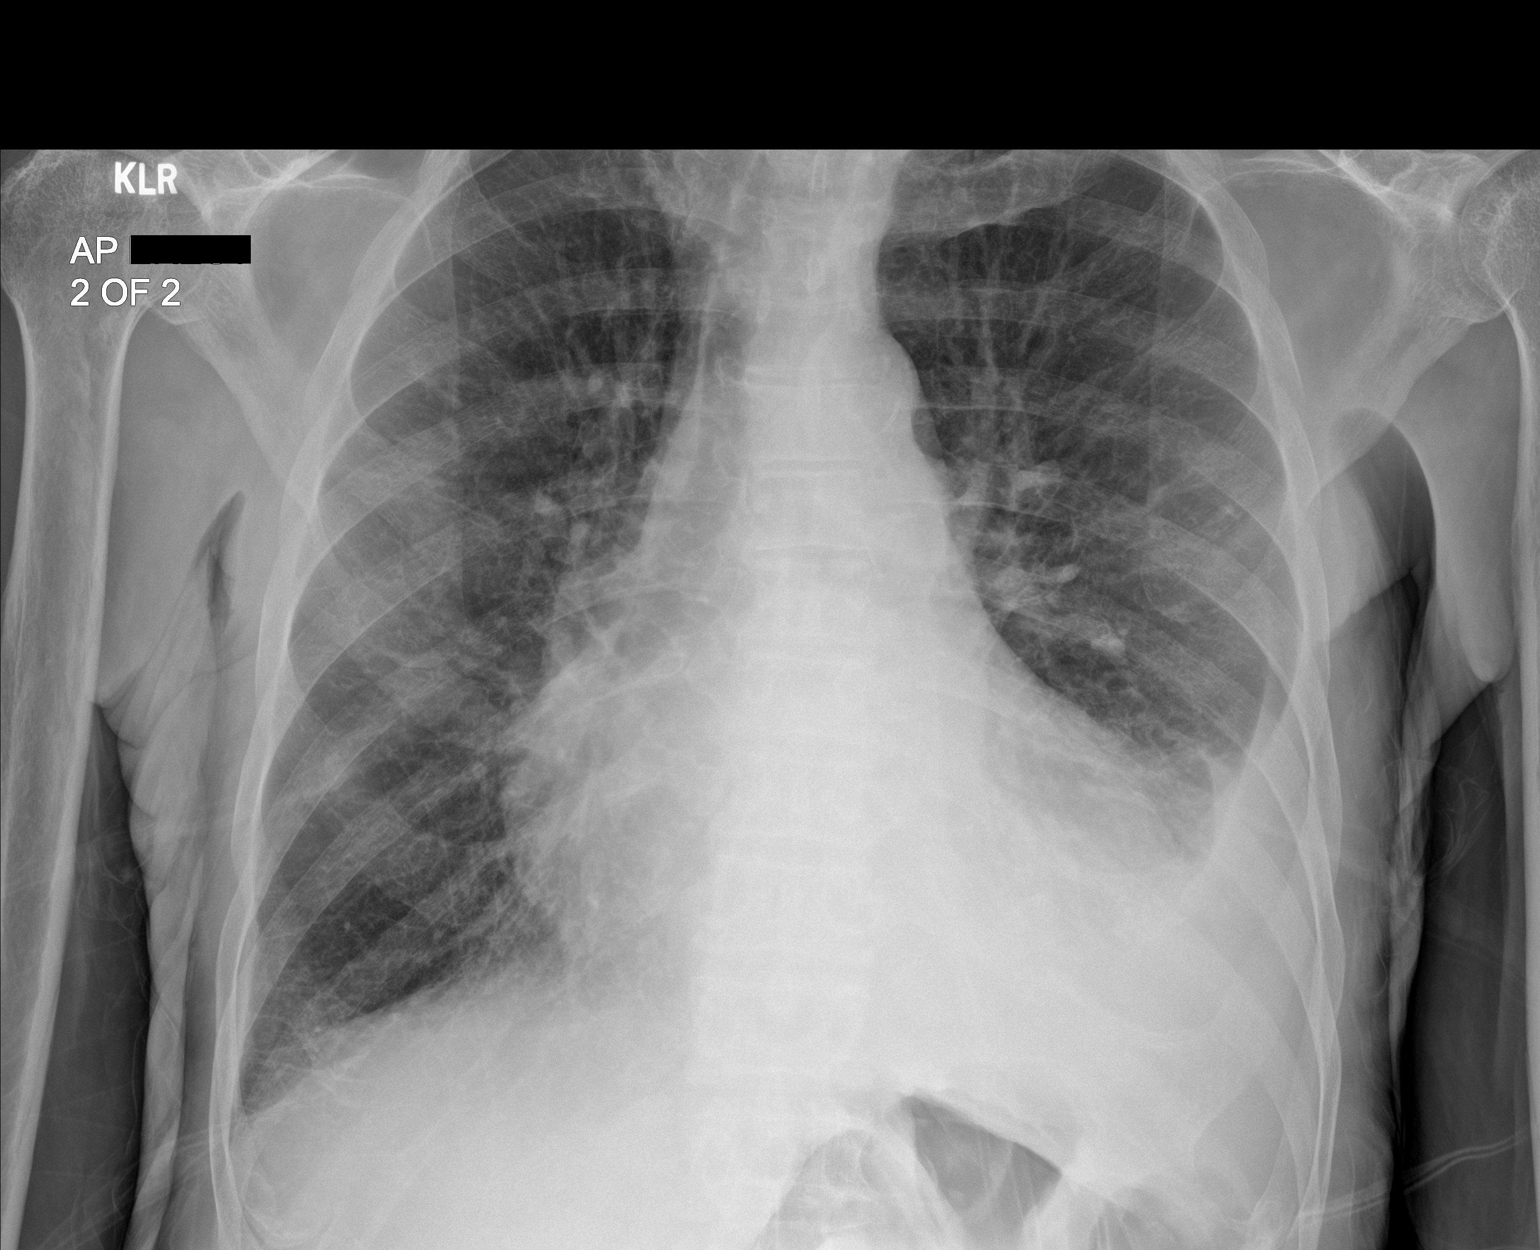

[chest ap (2 of 2)]
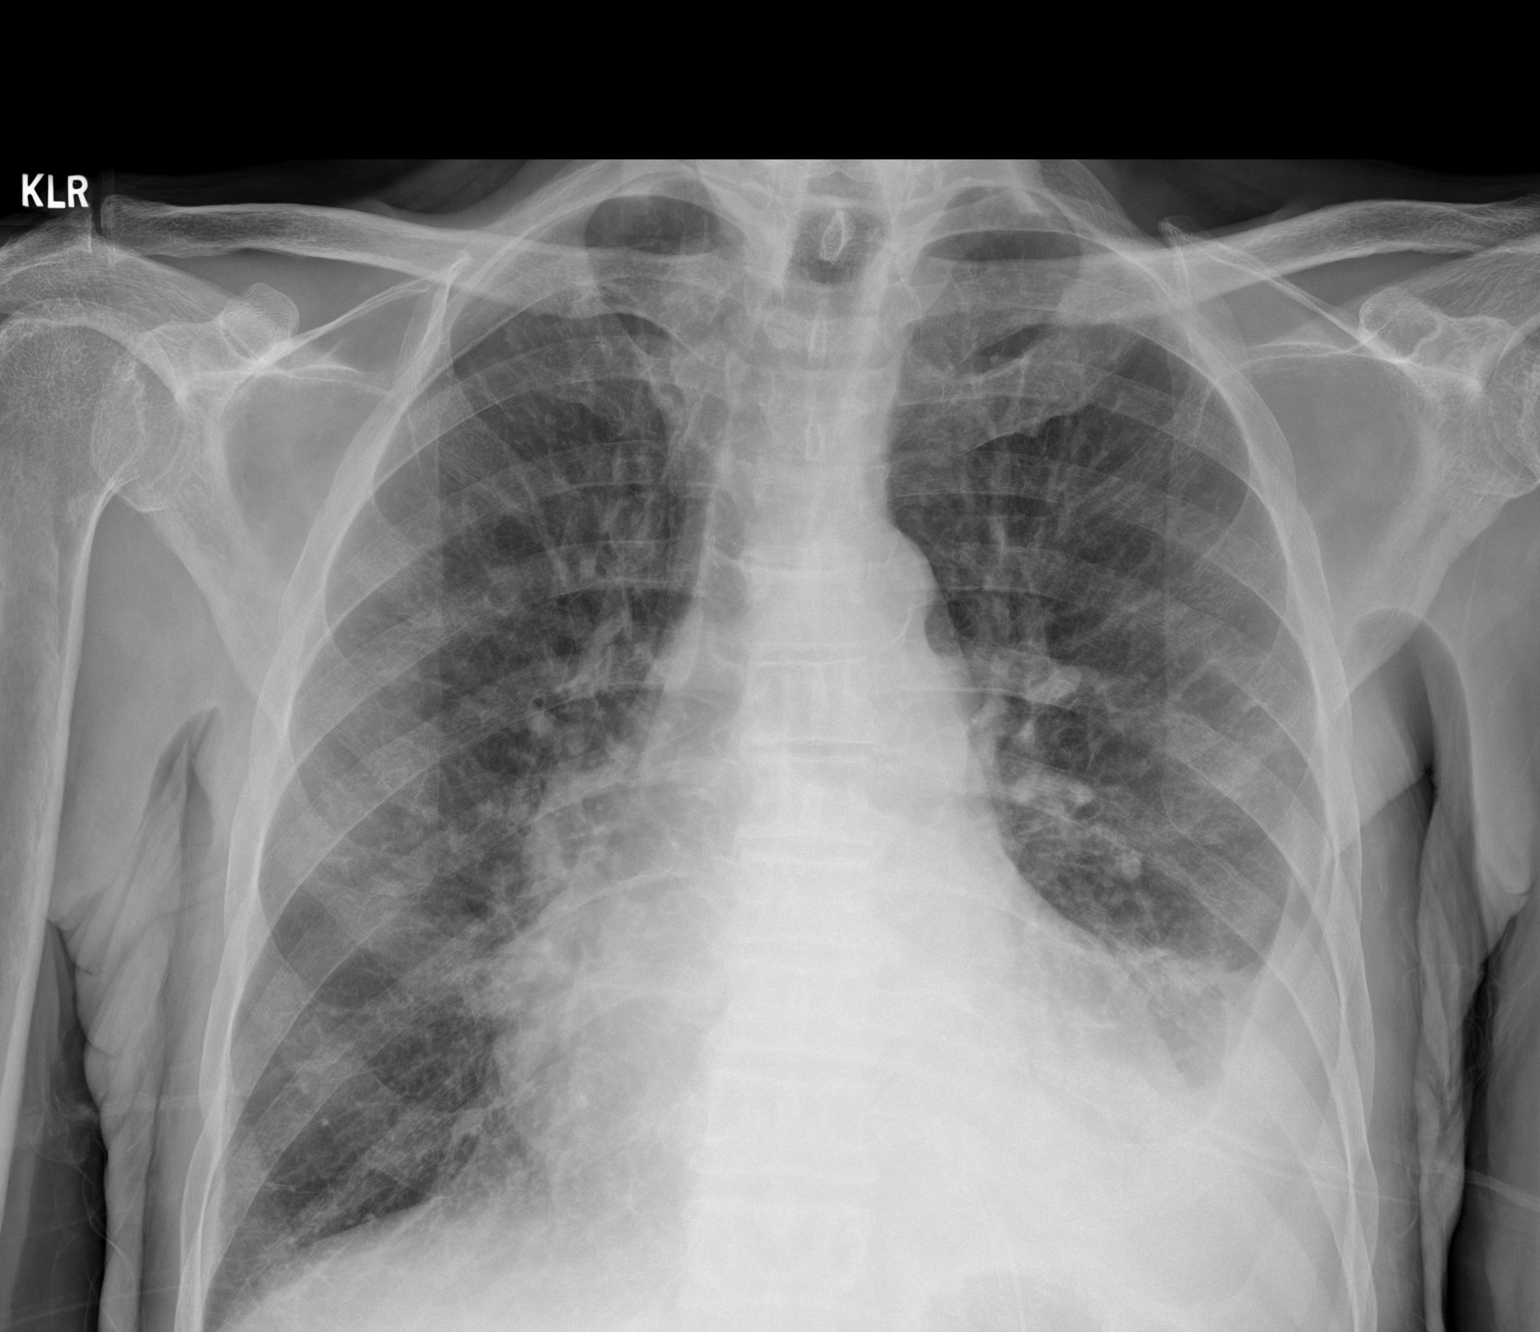

[2 of 2 positions shown; findings below may reference images not displayed]

FINDINGS: Cardiomediastinal silhouette unchanged in size and contour. Similar
appearance of opacity at the left lung base obscuring the left
hemidiaphragm and the left heart border with meniscus at the
costophrenic angle.

Decreased pleuroparenchymal thickening and blunting of the right
costophrenic angle status post thoracentesis.

No pneumothorax.

Reticular opacities throughout the lungs with mild thickening of the
interlobular septa.
IMPRESSION: Improved right-sided pleural effusion status post thoracentesis,
with no complicating features.

Persisting left-sided pleural fluid and scarring/atelectasis.

## 2022-09-08 IMAGING — US US THORACENTESIS ASP PLEURAL SPACE W/IMG GUIDE
1 series · 4 of 4 positions shown · non-contrast
Comparison: none

INDICATION: Patient with a history of congestive heart failure and recurrent
pleural effusions. Interventional radiology asked to perform
therapeutic thoracentesis.

[Series 1: us thoracentesis asp pleural s · 4 of 4 slices shown]
[im 1/4]
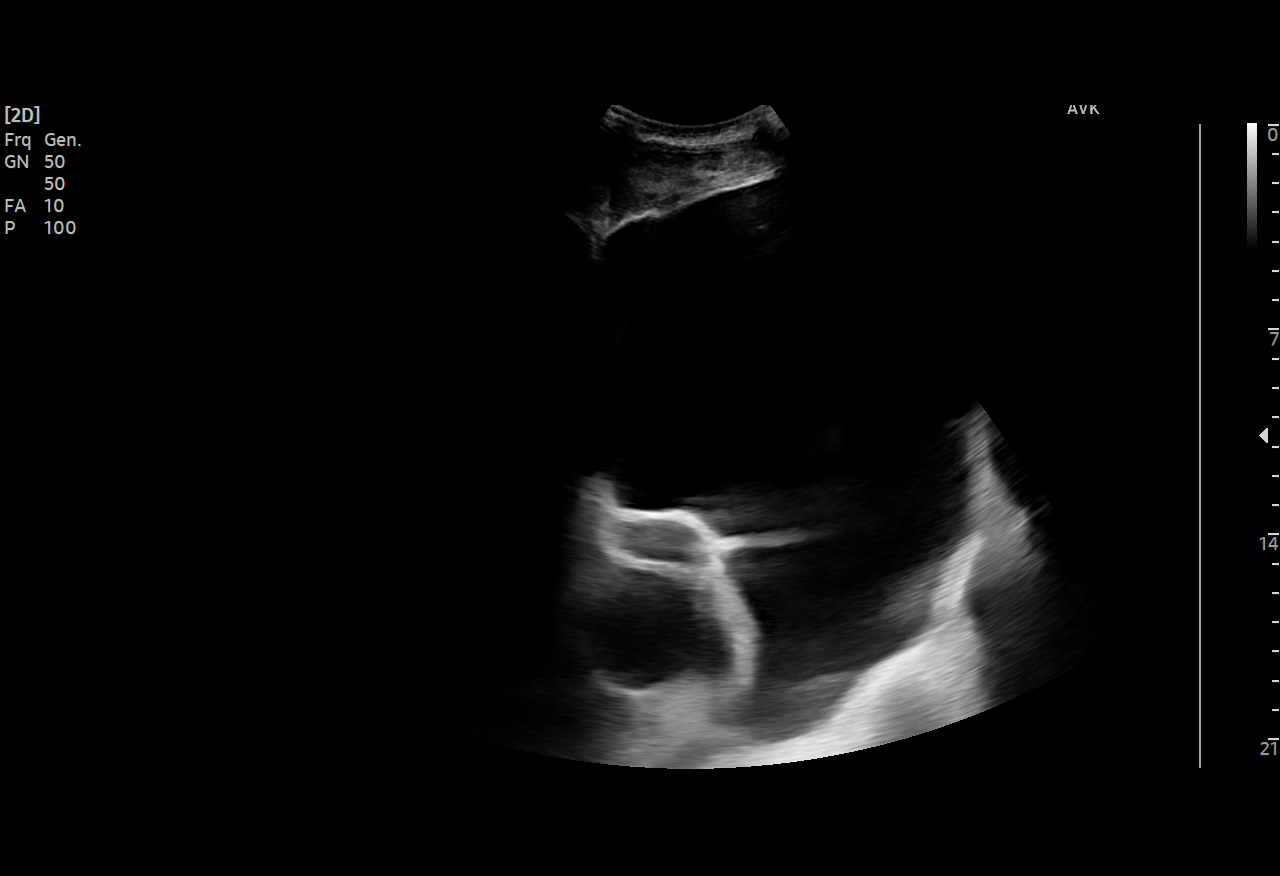
[im 2/4]
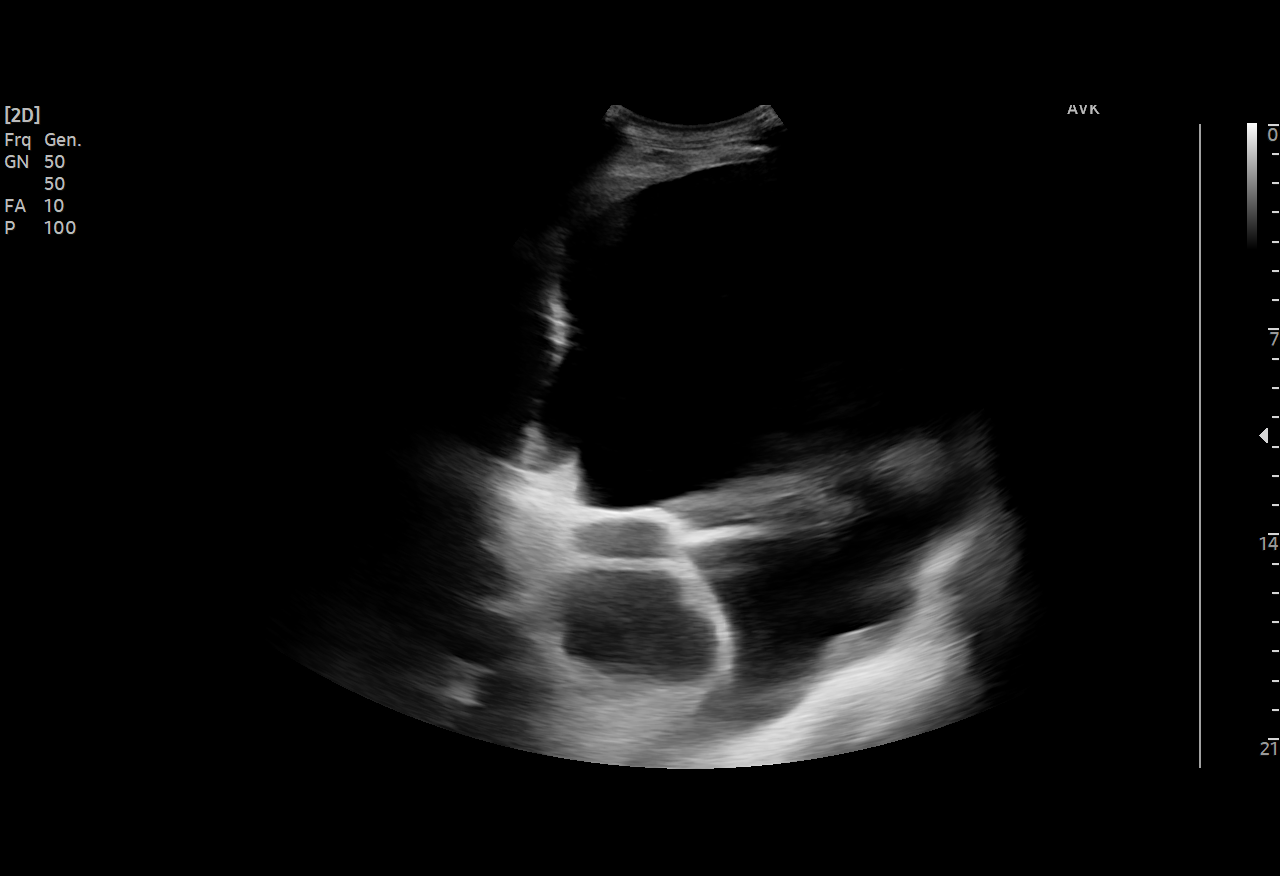
[im 3/4]
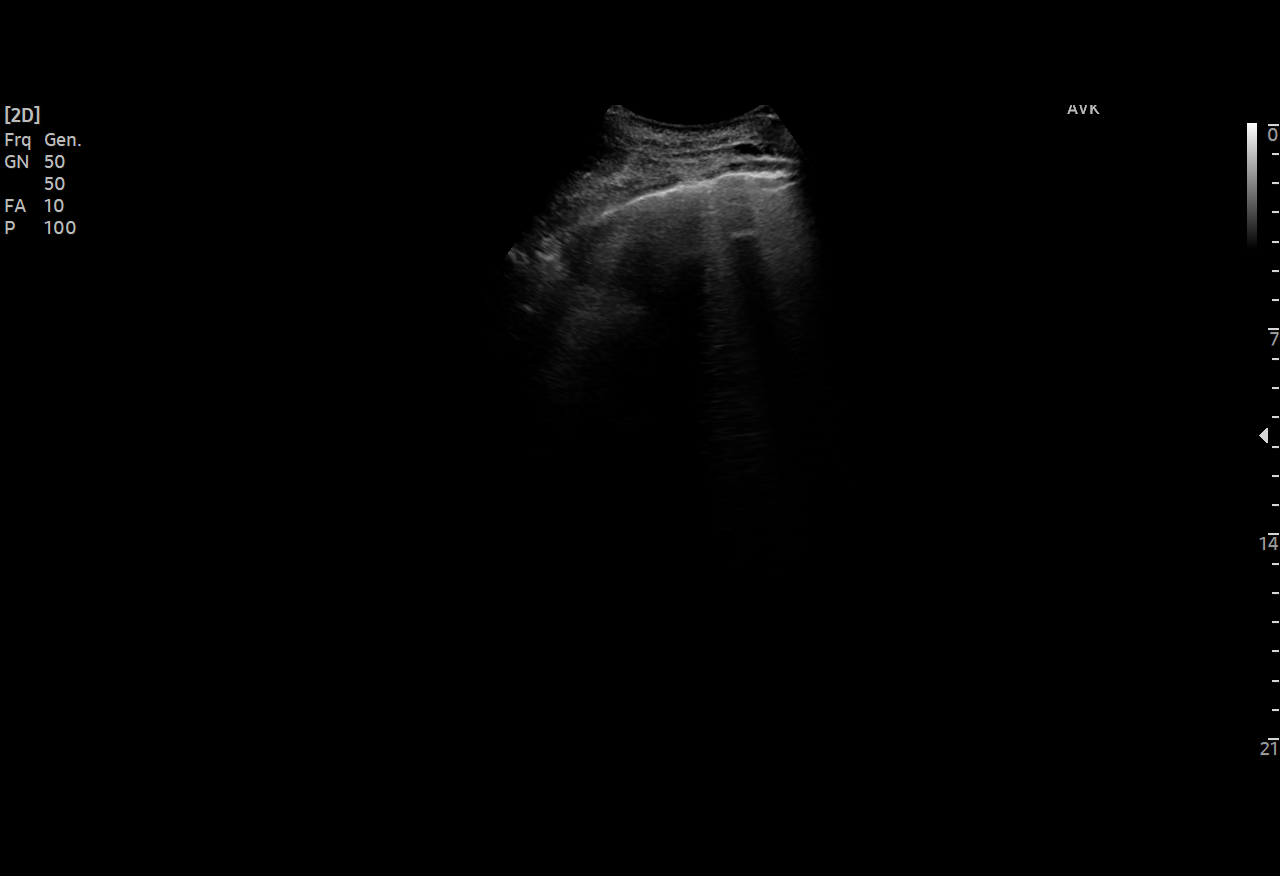
[im 4/4]
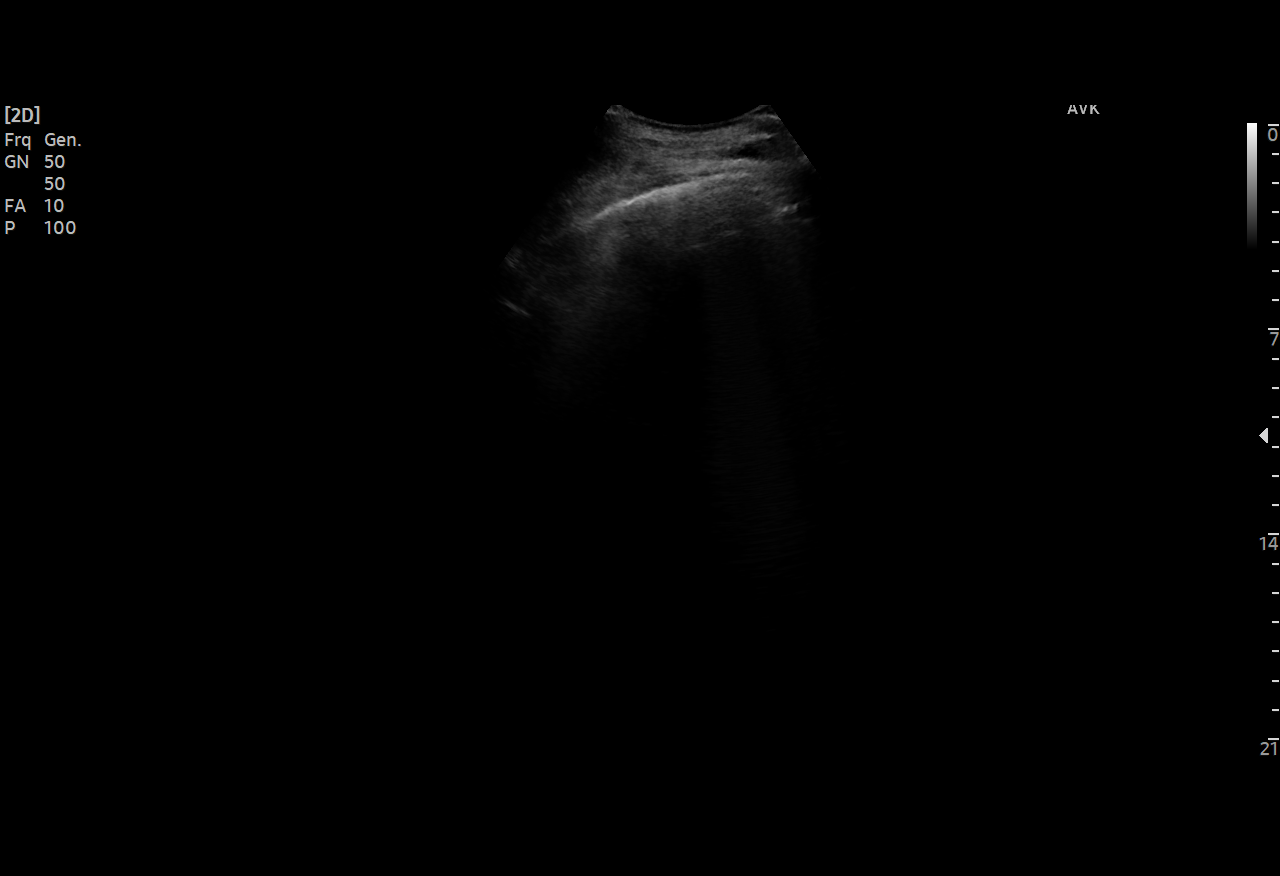

[4 of 4 positions shown; findings below may reference images not displayed]

EXAM:
ULTRASOUND GUIDED THORACENTESIS

MEDICATIONS:
1% lidocaine 15 mL

COMPLICATIONS:
None immediate.

PROCEDURE:
An ultrasound guided thoracentesis was thoroughly discussed with the
patient and questions answered. The benefits, risks, alternatives
and complications were also discussed. The patient understands and
wishes to proceed with the procedure. Written consent was obtained.

Ultrasound was performed to localize and mark an adequate pocket of
fluid in the right chest. The area was then prepped and draped in
the normal sterile fashion. 1% Lidocaine was used for local
anesthesia. Under ultrasound guidance a 6 Fr Safe-T-Centesis
catheter was introduced. Thoracentesis was performed. The catheter
was removed and a dressing applied.
FINDINGS: A total of approximately 0533 ml of clear yellow fluid was removed.
IMPRESSION: Successful ultrasound guided right thoracentesis yielding 0533 ml of
pleural fluid. Read by: Interisti Tonmoy, NP

## 2022-10-10 IMAGING — CT CT CHEST W/ CM
1 series · 15 of 34 positions shown, 19 images · IV contrast (omnipaque)
Comparison: Chest CT without contrast 08/25/2018. Chest radiographs
08/18/2020 and earlier.

CLINICAL DATA: 79-year-old male with increasing shortness of breath
for several weeks. No known injury. No known malignancy. Recurrent
pleural effusions status post thoracentesis in [REDACTED] and [place].

EXAM:
CT CHEST WITH CONTRAST
TECHNIQUE: Multidetector CT imaging of the chest was performed during
intravenous contrast administration.
CONTRAST:  65mL OMNIPAQUE IOHEXOL 300 MG/ML  SOLN

[Series 3: axial st · axial · 0.67mm/px · z∈[-706,-386]mm · 15 of 188 slices shown, 19 images]
[im 14/188  mediastinal]
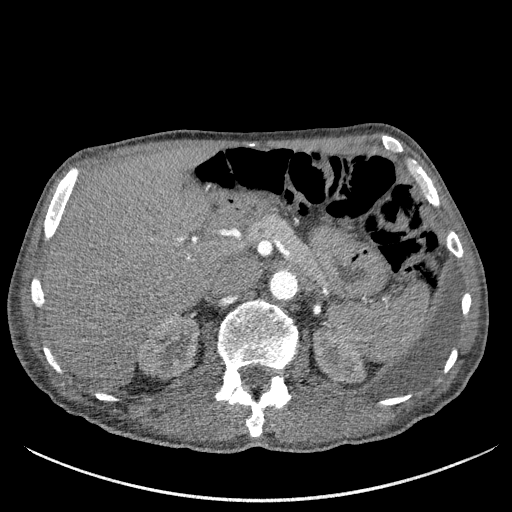
[im 14/188  lung]
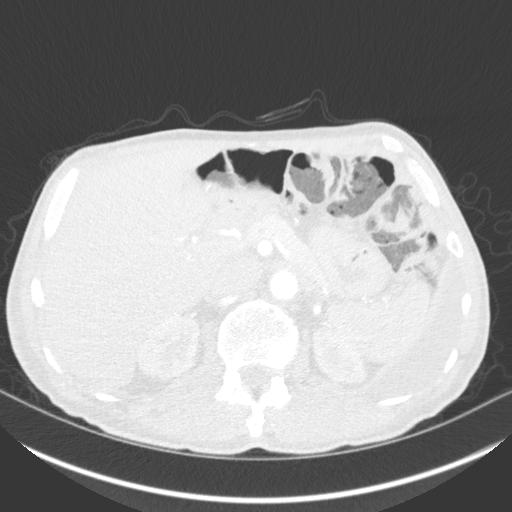
[im 28/188  lung]
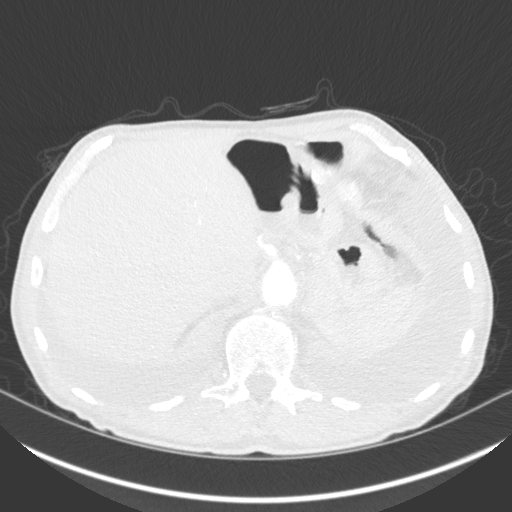
[im 38/188  lung]
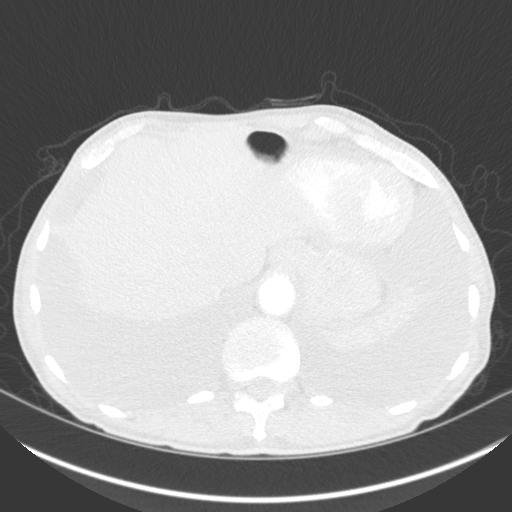
[im 49/188  lung]
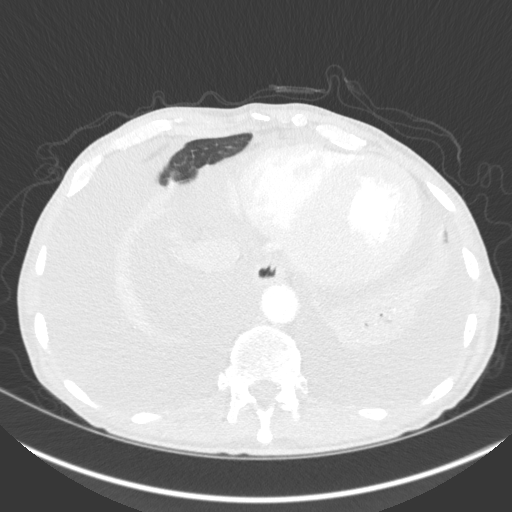
[im 63/188  mediastinal]
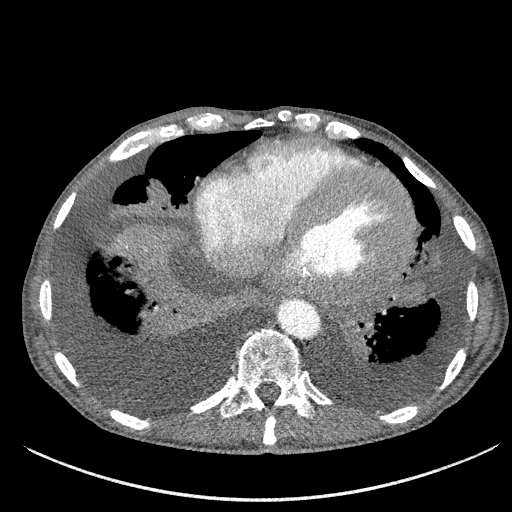
[im 63/188  lung]
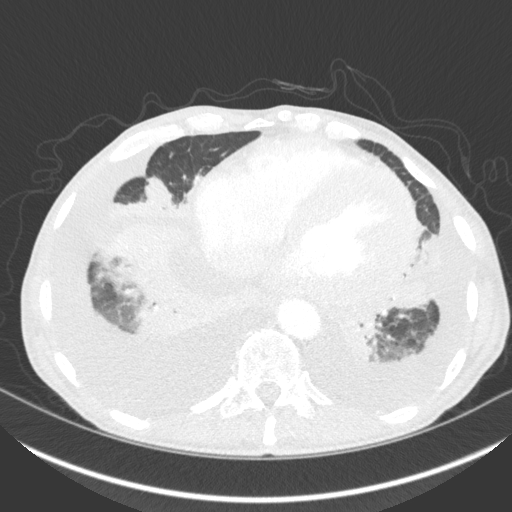
[im 75/188  lung]
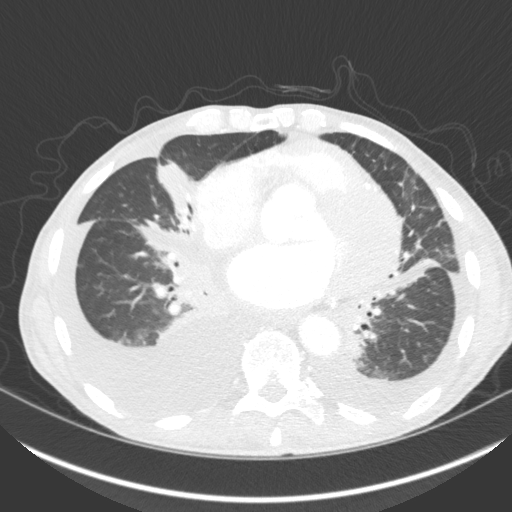
[im 84/188  lung]
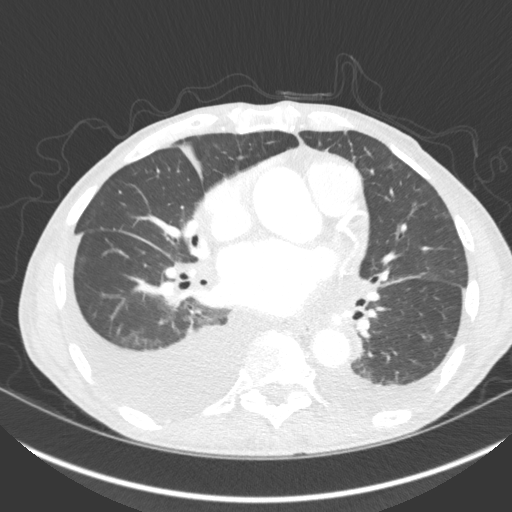
[im 97/188  lung]
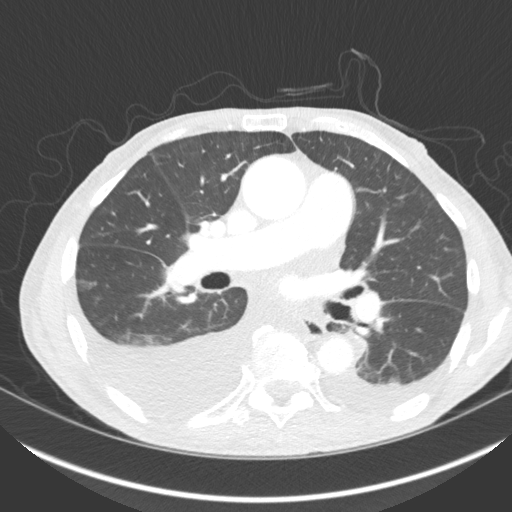
[im 104/188  mediastinal]
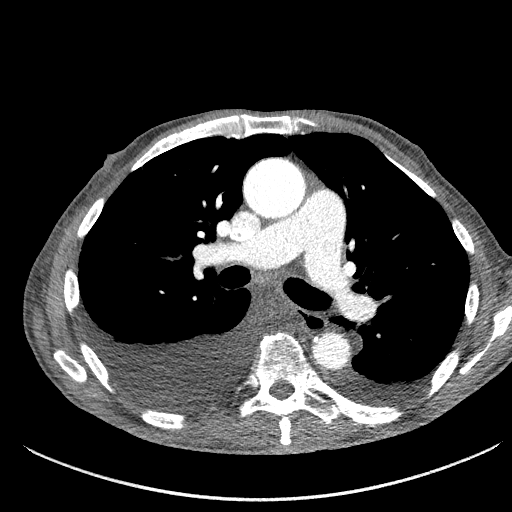
[im 104/188  lung]
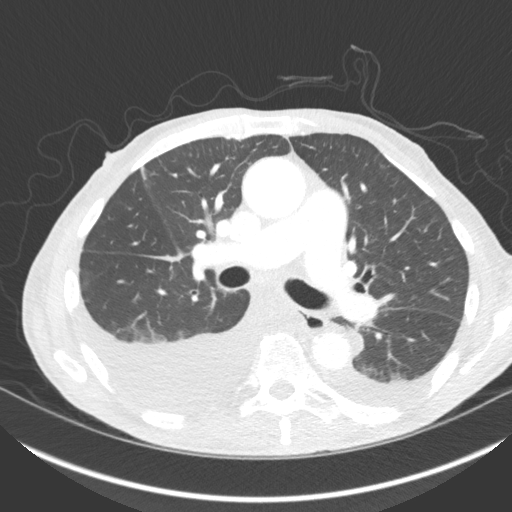
[im 113/188  lung]
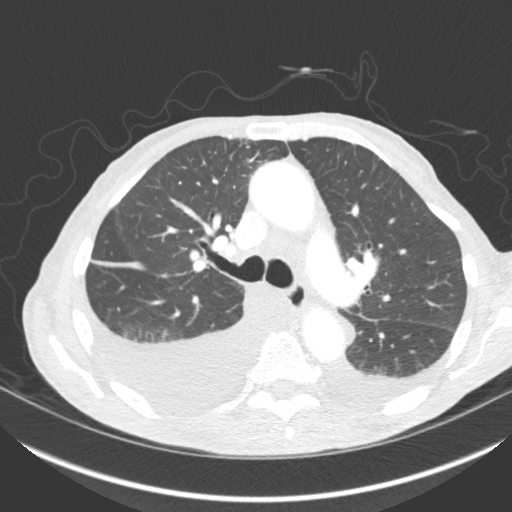
[im 125/188  lung]
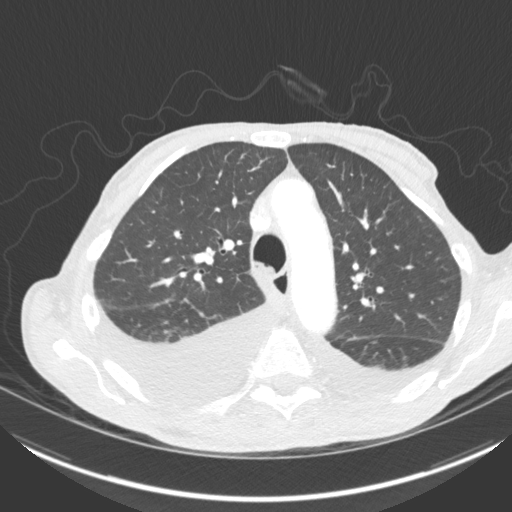
[im 139/188  lung]
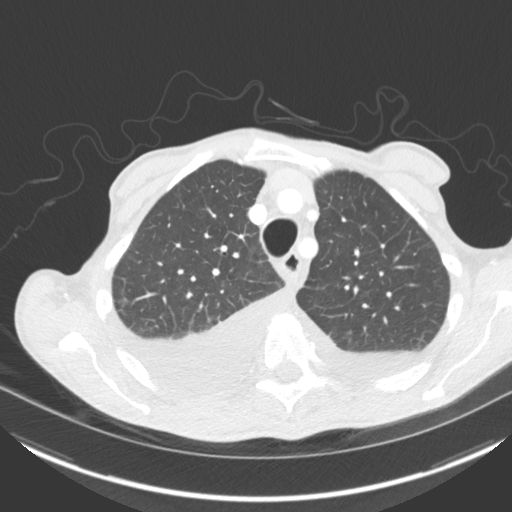
[im 150/188  mediastinal]
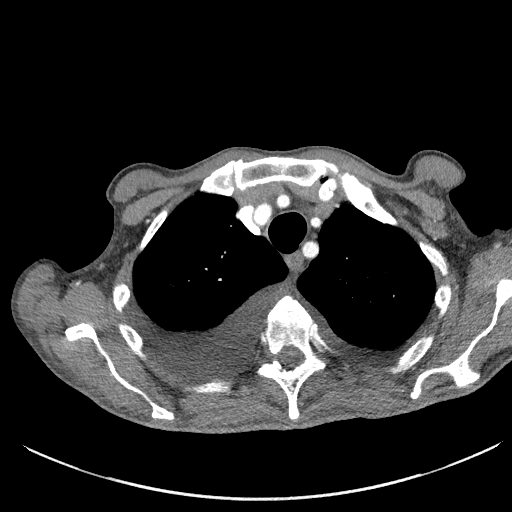
[im 150/188  lung]
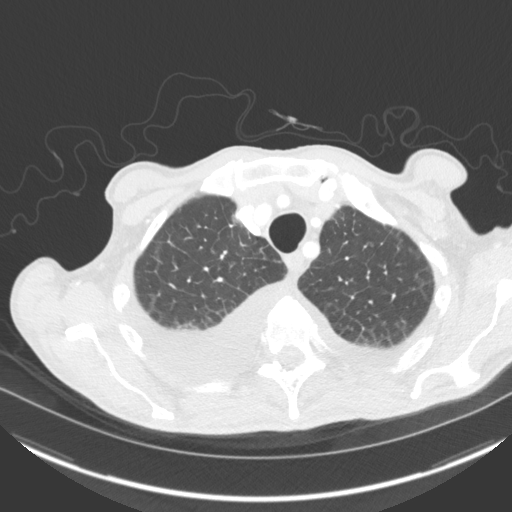
[im 160/188  lung]
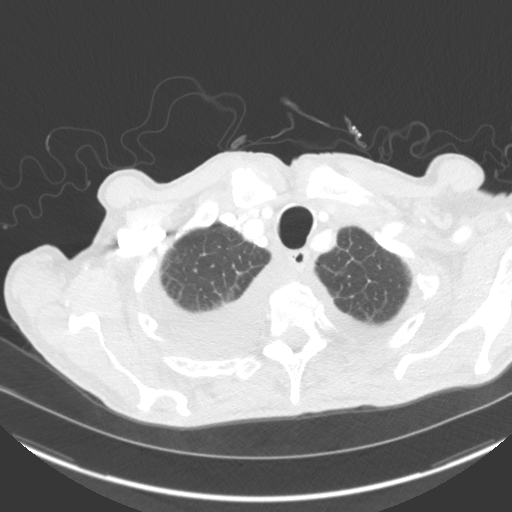
[im 174/188  lung]
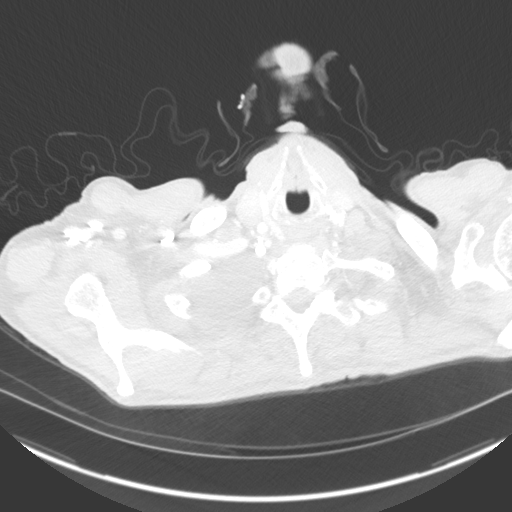

[15 of 34 positions shown; findings below may reference images not displayed]

FINDINGS: Cardiovascular: Stable tortuosity of the thoracic aorta since 5151.
Descending aortic calcified atherosclerosis. Calcified left coronary
artery atherosclerosis on series 3, image 104. Cardiac size remains
within normal limits. No pericardial effusion.

Mediastinum/Nodes: No mediastinal mass or lymphadenopathy.

Lungs/Pleura: Bilateral sub pulmonic and layering pleural effusions
with simple fluid density. The right effusion is moderate to large.
The left effusion is small to moderate. No pleural thickening.

Moderate superimposed bilateral lower lobe and middle lobe enhancing
pulmonary atelectasis.

Major airways are patent although there are globular retained
secretions in the lower trachea just above the carina (series 4,
image 64).

Mild bilateral upper lobe pulmonary septal thickening. Superimposed
small right apical lung nodules are stable since 5151 and therefore
benign. No alveolar pulmonary edema.

Upper Abdomen: Negative visible liver, gallbladder, spleen,
pancreas, kidneys and bowel in the upper abdomen.

Musculoskeletal: No acute or suspicious osseous lesion.
IMPRESSION: 1. Bilateral sub-pulmonic and layering pleural effusions with
low-density fluid favoring transudate.
Right effusion is moderate to large, left up to moderate. Associated
bilateral middle and lower lobe atelectasis.
2. Upper lobe pulmonary septal thickening raises the possibility of
mild or developing interstitial edema.
3. Calcified left coronary artery and Aortic Atherosclerosis
([id number]).
